# Patient Record
Sex: Male | Born: 1955 | Race: White | Hispanic: No | Marital: Married | Smoking: Never smoker
Health system: Southern US, Community
[De-identification: ages and names within clinical notes are randomized; demographics above are authoritative.]

## PROBLEM LIST (undated history)

## (undated) DIAGNOSIS — N189 Chronic kidney disease, unspecified: Secondary | ICD-10-CM

## (undated) DIAGNOSIS — T7840XA Allergy, unspecified, initial encounter: Secondary | ICD-10-CM

## (undated) DIAGNOSIS — E785 Hyperlipidemia, unspecified: Secondary | ICD-10-CM

## (undated) HISTORY — PX: SEPTOPLASTY: SUR1290

## (undated) HISTORY — DX: Hyperlipidemia, unspecified: E78.5

## (undated) HISTORY — PX: SHOULDER ARTHROSCOPY: SHX128

## (undated) HISTORY — DX: Chronic kidney disease, unspecified: N18.9

## (undated) HISTORY — PX: COLONOSCOPY: SHX174

## (undated) HISTORY — DX: Allergy, unspecified, initial encounter: T78.40XA

## (undated) HISTORY — PX: POLYPECTOMY: SHX149

---

## 2000-09-12 ENCOUNTER — Encounter: Payer: Self-pay | Admitting: Urology

## 2000-09-12 ENCOUNTER — Encounter: Admission: RE | Admit: 2000-09-12 | Discharge: 2000-09-12 | Payer: Self-pay | Admitting: Urology

## 2004-12-15 ENCOUNTER — Ambulatory Visit: Payer: Self-pay | Admitting: Internal Medicine

## 2004-12-29 ENCOUNTER — Ambulatory Visit: Payer: Self-pay | Admitting: Internal Medicine

## 2004-12-29 ENCOUNTER — Encounter (INDEPENDENT_AMBULATORY_CARE_PROVIDER_SITE_OTHER): Payer: Self-pay | Admitting: Specialist

## 2007-05-21 ENCOUNTER — Ambulatory Visit (HOSPITAL_BASED_OUTPATIENT_CLINIC_OR_DEPARTMENT_OTHER): Admission: RE | Admit: 2007-05-21 | Discharge: 2007-05-21 | Payer: Self-pay | Admitting: Orthopedic Surgery

## 2007-11-25 ENCOUNTER — Ambulatory Visit: Payer: Self-pay | Admitting: Cardiology

## 2007-11-26 ENCOUNTER — Ambulatory Visit: Payer: Self-pay | Admitting: Cardiology

## 2007-11-26 LAB — CONVERTED CEMR LAB
BUN: 20 mg/dL (ref 6–23)
CO2: 25 meq/L (ref 19–32)
Calcium: 9.2 mg/dL (ref 8.4–10.5)
Chloride: 103 meq/L (ref 96–112)
Creatinine, Ser: 1 mg/dL (ref 0.4–1.5)
GFR calc Af Amer: 101 mL/min
GFR calc non Af Amer: 83 mL/min
Glucose, Bld: 95 mg/dL (ref 70–99)
Potassium: 4.1 meq/L (ref 3.5–5.1)
Sodium: 136 meq/L (ref 135–145)

## 2007-12-02 ENCOUNTER — Ambulatory Visit (HOSPITAL_COMMUNITY): Admission: RE | Admit: 2007-12-02 | Discharge: 2007-12-02 | Payer: Self-pay | Admitting: Cardiology

## 2007-12-02 ENCOUNTER — Ambulatory Visit: Payer: Self-pay | Admitting: Cardiovascular Disease

## 2009-06-21 ENCOUNTER — Ambulatory Visit (HOSPITAL_BASED_OUTPATIENT_CLINIC_OR_DEPARTMENT_OTHER): Admission: RE | Admit: 2009-06-21 | Discharge: 2009-06-21 | Payer: Self-pay | Admitting: Orthopedic Surgery

## 2010-07-03 LAB — POCT HEMOGLOBIN-HEMACUE: Hemoglobin: 13.7 g/dL (ref 13.0–17.0)

## 2010-07-14 ENCOUNTER — Other Ambulatory Visit: Payer: Self-pay | Admitting: Plastic Surgery

## 2010-07-29 ENCOUNTER — Other Ambulatory Visit: Payer: Self-pay | Admitting: Plastic Surgery

## 2010-08-23 NOTE — Assessment & Plan Note (Signed)
[facility] HEALTHCARE                            CARDIOLOGY OFFICE NOTE   Edgar Clark, Edgar Clark                        MRN:          [id number]  DATE:11/25/2007                            DOB:          07/28/1955    Edgar Clark is a 55 year old married white male, friend of mine, who  comes self-referred today for premature history of coronary disease.   He is extremely active, works out 4-5 days a week at [employer]  with me, and has no symptoms of angina or ischemia.  He also snowboards  and skis at high altitude and has no symptoms.   His real concern is that his brother just died at age 22 of a heart  attack.  His brother was not nearly as good shape as Edgar Clark, however, his  father also had a heart attack at age 50 (at that time his father was  overweight), and he has a half-brother who had a heart attack at age 57.  He has several risk factors including being overweight.   Edgar Clark has had some mild hyperlipidemia in the past and is on Lipitor, but  does not remember the dose.  He also takes 81 mg of aspirin per day.  His last cholesterol, he says, were around 150.  His HDL was greater  than 40 but he cannot remember the number.  LDL was around 70s and  triglycerides were okay.   PAST MEDICAL HISTORY:  He does not smoke.   SURGICAL HISTORY:  He had a shoulder rotator cuff repair in February  2009.  He has had a deviated septum repair in 1968.   He has a couple of alcoholic beverages a day.   His family history is as above.   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  He travels quite a bit  particularly in [place].   He is married and has 2 children.   REVIEW OF SYSTEMS:  Other than some seasonal allergies and some anxiety  he really has a negative review of systems.  All systems queried and  reviewed.   PHYSICAL EXAMINATION:  GENERAL:  He is 6 feet 3, weighs 200 pounds.  VITAL SIGNS:  His blood pressure 120/88 in his left arm.  His pulse  is  66 and regular.  HEENT:  Normocephalic and atraumatic.  PERRL.  Extraocular movements are  intact.  Sclerae are clear.  Facial symmetry is normal.  Dentition  satisfactory.  NECK:  Supple.  Carotids upstrokes are equal bilateral without bruits.  No JVD.  Thyroid is not enlarged.  Trachea is midline.  LUNGS:  Clear.  HEART:  A nondisplaced PMI.  Normal S1 and S2.  ABDOMEN:  Soft, good bowel sounds.  No midline bruit.  No hepatomegaly.  EXTREMITIES:  No cyanosis, clubbing, or edema.  Pulses are intact.  NEUROLOGIC:  Exam is intact.   Electrocardiogram shows normal sinus rhythm with minimal first-degree AV  block at 222 milliseconds.  There is some nonspecific ST-segment  changes.   ASSESSMENT AND PLAN:  I had a long talk with Edgar Clark  today.  He obviously  is at low-risk based on conventional risk factors, particularly with  being on a statin.  However, he is very concerned about his family  history as I told him today a lot of patient's have heart attacks or  coronary disease and this is not explained by conventional factors.   After a long discussion, we decided to proceed with CT angiography.  If  his arteries are pristine or has minimal plaque this will be reassuring,  particularly with his outdoor activities not to mention his work hours  and traveling.   PLAN:  1. Continue Lipitor.  Goal LDL would be 70.  2. Continue enteric-coated aspirin.  3. CT angiography.   Indications, risks, and potential benefits have been discussed.  Edgar Clark  agrees to proceed.  We will try to arrange this on the day when Dr.  Charlton Haws, our CT reader is available in the hospital.     Edgar Fus C. Daleen Squibb, MD, [facility]  Electronically Signed    TCW/MedQ  DD: 11/25/2007  DT: 11/26/2007  Job #: [id number]   cc:   Antony Madura, M.D.

## 2010-08-23 NOTE — Op Note (Signed)
NAME:  Edgar Clark, Edgar Clark                 ACCOUNT NO.:  [id number]   MEDICAL RECORD NO.:  [id number]          PATIENT TYPE:  AMB   LOCATION:  DSC                          FACILITY:  [facility]   PHYSICIAN:  Robert A. Thurston Hole, M.D. DATE OF BIRTH:  Jul 07, 1955   DATE OF PROCEDURE:  05/21/2007  DATE OF DISCHARGE:                               OPERATIVE REPORT   PREOPERATIVE DIAGNOSIS:  1. Left shoulder SLAP tear.  2. Left shoulder partial rotator cuff tear.  3. Left shoulder impingement.  4. 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   POSTOPERATIVE DIAGNOSIS:  1. Left shoulder SLAP tear.  2. Left shoulder partial rotator cuff tear.  3. Left shoulder impingement.  4. 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   PROCEDURE:  1. Left shoulder examination under anesthesia followed by      arthroscopically assisted SLAP repair using Arthrex push lock      anchors x2.  2. Left shoulder debridement of partial rotator cuff tear.  3. Left shoulder subacromial decompression.  4. Left shoulder distal clavicle excision.   SURGEON:  Elana Alm. Thurston Hole, M.D.   ASSISTANT:  Kirstin Shepperson, P.A.-C.   ANESTHESIA:  General.   OPERATIVE TIME:  1 hour 15 minutes.   COMPLICATIONS:  None.   INDICATIONS FOR PROCEDURE:  Edgar Clark is a 55 year old gentleman who  has had almost two years of left shoulder pain increasing in nature with  exam and MRI documenting rotator cuff tendinitis versus labral partial  tear with impingement and AC joint arthropathy.  He has failed  conservative care and is now to undergo arthroscopy.   DESCRIPTION:  Edgar Clark is brought to the operating room on May 21, 2007, after an interscalene block was placed in the holding room by  anesthesia.  He was placed on the operating table in supine position.  He received Ancef 1 gram IV preoperatively for prophylaxis.  After being  placed under  general anesthesia, his left shoulder was examined.  He had  full range of motion of his shoulder with stable ligamentous exam.  He  was then placed in the beach chair position and his shoulder and arm was  prepped using sterile DuraPrep and draped using sterile technique.  Originally, through a posterior arthroscopic portal, the arthroscope  with a pump attached was placed into an anterior portal and an  arthroscopic probe was placed.  On initial inspection, the articular  cartilage in the glenohumeral joint was intact.  The anterior labrum in  the mid portion and anterior inferior glenohumeral ligament complex was  intact.  The superior labrum was torn and detached from the superior  glenoid rim from the 10 o'clock position to the 2 o'clock position.  The  biceps tendon, itself, was intact, but the biceps tendon anchor was very  loose and detached in this area.  The posterior inferior labrum was  intact.  The rotator cuff showed partial tearing 25% of the  supraspinatus which was debrided.  The rest of the rotator  cuff was  intact.  The inferior capsular recess was free of pathology.  This  superior SLAP tear was felt to be amenable to repair.  Using an  accessory lateral portal through the rotator cuff, two separate Arthrex  push lock anchors were placed after the superior glenoid rim was  decorticated. Using a curved suture passer, one of the mattress sutures  was placed in the 1 to 2 o'clock position and the other one placed in  the 10 to 11 o'clock position and the two push lock anchors were  deployed, one in the 11 o'clock position and one in the 1 o'clock  position on either side of the biceps anchor with a firm and tight  fixation and repair achieved.  After this was done, the subacromial  space was entered and using a lateral portal, moderately thickened  bursitis was resected.  Impingement was noted and a subacromial  decompression was carried out removing 6-8 mm of the under  surface of  the anterior, anterolateral, and anteromedial acromion and CA ligament  release carried out, as well.  The [facility] joint showed significant spurring  and degenerative changes and the distal 5-6 mm of clavicle was resected  with a 6 mm bur.  The rotator cuff, itself, on the bursal surface was  somewhat inflamed with no evidence of a tear.  After this decompression  and distal clavicle excision was achieved, the shoulder was brought  through a full range of motion with no impingement on the rotator cuff.  At this point, it was felt that all pathology had been satisfactorily  addressed.  The instruments were removed.  The portals were closed with  3-0 nylon suture.  A sterile dressing and an abduction sling was  applied.  The patient was awakened and taken to recovery in stable  condition.   FOLLOW UP CARE:  Edgar Clark will be followed as an outpatient on  Percocet and Robaxin in an abduction sling with early physical therapy  per SLAP repair protocol.  I will see him back in the office in a week  for sutures out and follow up.      Robert A. Thurston Hole, M.D.  Electronically Signed     RAW/MEDQ  D:  05/21/2007  T:  05/22/2007  Job:  [id number]

## 2012-06-24 ENCOUNTER — Telehealth: Payer: Self-pay

## 2012-06-24 NOTE — Telephone Encounter (Signed)
Message copied by Chrystie Nose on Mon Jun 24, 2012  3:18 PM ------      Message from: Hilarie Fredrickson      Created: Fri Jun 21, 2012  3:29 PM      Regarding: direct colonoscopy       Bonita Quin,       Mr. Erbes is a very good friend of mine. He needs set up for a direct colonoscopy in the [facility] for "rectal bleeding". You can reach him at [phone] to help arrange. Thank you!  ------

## 2012-06-24 NOTE — Telephone Encounter (Signed)
Called pt to scheduled colon. Pt states he will call back to schedule once he looks at his calendar.

## 2014-04-28 ENCOUNTER — Encounter: Payer: Self-pay | Admitting: Internal Medicine

## 2014-05-20 ENCOUNTER — Encounter: Payer: Self-pay | Admitting: Internal Medicine

## 2014-06-22 ENCOUNTER — Telehealth: Payer: Self-pay

## 2014-06-22 NOTE — Telephone Encounter (Signed)
-----   Message from Irene Shipper, MD sent at 06/21/2014  2:41 PM EDT ----- Regarding: Colonoscopy prep instructions Vaughan Basta, Mr. Downs (a very good friend of mine) works in [place] during the week and was hoping to forego the pre-visit and receive instructions for his upcoming colonoscopy by phone / fax. Please handle this personally for me. His cell number is [phone]. Thank you!!! Dr. [name]

## 2014-06-22 NOTE — Telephone Encounter (Signed)
Left message for pt to call back.  Left message to call back. 06/24/14

## 2014-06-24 ENCOUNTER — Other Ambulatory Visit: Payer: Self-pay

## 2014-06-24 MED ORDER — PEG-KCL-NACL-NASULF-NA ASC-C 100 G PO SOLR: 1.0000 | Freq: Once | ORAL | Status: DC

## 2014-06-24 NOTE — Telephone Encounter (Signed)
Instructions faxed to pt at [phone], prescription for prep sent to pharmacy for pt.

## 2014-07-15 ENCOUNTER — Ambulatory Visit (AMBULATORY_SURGERY_CENTER): Payer: PRIVATE HEALTH INSURANCE | Admitting: Internal Medicine

## 2014-07-15 ENCOUNTER — Encounter: Payer: Self-pay | Admitting: Internal Medicine

## 2014-07-15 VITALS — BP 128/58 | HR 59 | Temp 96.6°F | Resp 14 | Ht 75.0 in | Wt 205.0 lb

## 2014-07-15 DIAGNOSIS — Z1211 Encounter for screening for malignant neoplasm of colon: Secondary | ICD-10-CM | POA: Diagnosis present

## 2014-07-15 DIAGNOSIS — D122 Benign neoplasm of ascending colon: Secondary | ICD-10-CM

## 2014-07-15 DIAGNOSIS — D123 Benign neoplasm of transverse colon: Secondary | ICD-10-CM

## 2014-07-15 MED ORDER — SODIUM CHLORIDE 0.9 % IV SOLN: 500.0000 mL | INTRAVENOUS | Status: DC

## 2014-07-15 NOTE — Patient Instructions (Signed)
YOU HAD AN ENDOSCOPIC PROCEDURE TODAY AT [facility]:   Refer to the procedure report that was given to you for any specific questions about what was found during the examination.  If the procedure report does not answer your questions, please call your gastroenterologist to clarify.  If you requested that your care partner not be given the details of your procedure findings, then the procedure report has been included in a sealed envelope for you to review at your convenience later.  YOU SHOULD EXPECT: Some feelings of bloating in the abdomen. Passage of more gas than usual.  Walking can help get rid of the air that was put into your GI tract during the procedure and reduce the bloating. If you had a lower endoscopy (such as a colonoscopy or flexible sigmoidoscopy) you may notice spotting of blood in your stool or on the toilet paper. If you underwent a bowel prep for your procedure, you may not have a normal bowel movement for a few days.  Please Note:  You might notice some irritation and congestion in your nose or some drainage.  This is from the oxygen used during your procedure.  There is no need for concern and it should clear up in a day or so.  SYMPTOMS TO REPORT IMMEDIATELY:   Following lower endoscopy (colonoscopy or flexible sigmoidoscopy):  Excessive amounts of blood in the stool  Significant tenderness or worsening of abdominal pains  Swelling of the abdomen that is new, acute  Fever of 100F or higher   For urgent or emergent issues, a gastroenterologist can be reached at any hour by calling [phone].   DIET: Your first meal following the procedure should be a small meal and then it is ok to progress to your normal diet. Heavy or fried foods are harder to digest and may make you feel nauseous or bloated.  Likewise, meals heavy in dairy and vegetables can increase bloating.  Drink plenty of fluids but you should avoid alcoholic beverages for 24  hours.  ACTIVITY:  You should plan to take it easy for the rest of today and you should NOT DRIVE or use heavy machinery until tomorrow (because of the sedation medicines used during the test).    FOLLOW UP: Our staff will call the number listed on your records the next business day following your procedure to check on you and address any questions or concerns that you may have regarding the information given to you following your procedure. If we do not reach you, we will leave a message.  However, if you are feeling well and you are not experiencing any problems, there is no need to return our call.  We will assume that you have returned to your regular daily activities without incident.  If any biopsies were taken you will be contacted by phone or by letter within the next 1-3 weeks.  Please call us at [phone] if you have not heard about the biopsies in 3 weeks.    SIGNATURES/CONFIDENTIALITY: You and/or your care partner have signed paperwork which will be entered into your electronic medical record.  These signatures attest to the fact that that the information above on your After Visit Summary has been reviewed and is understood.  Full responsibility of the confidentiality of this discharge information lies with you and/or your care-partner.  Polys-handouts given

## 2014-07-15 NOTE — Op Note (Signed)
[place]  [address] [place], [id number]   COLONOSCOPY PROCEDURE REPORT  PATIENT: Edgar Clark, Edgar Clark  MR#: [id number] BIRTHDATE: Feb 12, 1956 , 53  yrs. old GENDER: male ENDOSCOPIST: Eustace Quail, MD REFERRED EK:[id number] Recall, PROCEDURE DATE:  07/15/2014 PROCEDURE:   Colonoscopy, screening and Colonoscopy with snare polypectomy x 4 First Screening Colonoscopy - Avg.  risk and is 50 yrs.  old or older - No.  Prior Negative Screening - Now for repeat screening. 10 or more years since last screening  History of Adenoma - Now for follow-up colonoscopy & has been > or = to 3 yrs.  N/A ASA CLASS:   Class II INDICATIONS:Screening for colonic neoplasia and Colorectal Neoplasm Risk Assessment for this procedure is average risk.  Negative exam 10 years ago MEDICATIONS: Monitored anesthesia care and Propofol 350 mg IV  DESCRIPTION OF PROCEDURE:   After the risks benefits and alternatives of the procedure were thoroughly explained, informed consent was obtained.  The digital rectal exam revealed no abnormalities of the rectum.   The LB [id number]  endoscope was introduced through the anus and advanced to the cecum, which was identified by both the appendix and ileocecal valve. No adverse events experienced.   The quality of the prep was excellent. (MoviPrep was used)  The instrument was then slowly withdrawn as the colon was fully examined.   COLON FINDINGS: Four polyps ranging between 3-71mm in size were found in the transverse colon (3) and ascending colon.  A polypectomy was performed with a cold snare.  The resection was complete, the polyp tissue was completely retrieved and sent to histology.   There was mild diverticulosis noted in the sigmoid colon and ascending colon. The examination was otherwise normal.  Retroflexed views revealed internal hemorrhoids. The time to cecum = 2.4 Withdrawal time = 22.6   The scope was withdrawn and the procedure  completed. COMPLICATIONS: There were no immediate complications.  ENDOSCOPIC IMPRESSION: 1.   Four polyps were found in the transverse colon and ascending colon; polypectomy was performed with a cold snare 2.   Mild diverticulosis was noted in the sigmoid colon and ascending colon 3.   The examination was otherwise normal  RECOMMENDATIONS: 1. Repeat colonoscopy in 5 years if polyp adenomatous or sessile serrated polyp; otherwise 10 years  eSigned:  Eustace Quail, MD 07/15/2014 11:01 AM   cc: Lorene Dy, MD and The Patient

## 2014-07-15 NOTE — Progress Notes (Signed)
Called to room to assist during endoscopic procedure.  Patient ID and intended procedure confirmed with present staff. Received instructions for my participation in the procedure from the performing physician.  

## 2014-07-15 NOTE — Progress Notes (Signed)
Patient awakening,vss,report to rn 

## 2014-07-16 ENCOUNTER — Telehealth: Payer: Self-pay | Admitting: *Deleted

## 2014-07-16 NOTE — Telephone Encounter (Signed)
No answer, message left for the patient. 

## 2014-07-20 ENCOUNTER — Encounter: Payer: Self-pay | Admitting: Internal Medicine

## 2014-08-03 ENCOUNTER — Telehealth: Payer: Self-pay | Admitting: *Deleted

## 2014-08-04 NOTE — Telephone Encounter (Signed)
Error entry

## 2014-08-25 ENCOUNTER — Other Ambulatory Visit: Payer: Self-pay | Admitting: Internal Medicine

## 2014-08-25 ENCOUNTER — Ambulatory Visit
Admission: RE | Admit: 2014-08-25 | Discharge: 2014-08-25 | Disposition: A | Discharge status: Home / Self Care | Payer: PRIVATE HEALTH INSURANCE | Source: Ambulatory Visit | Attending: Internal Medicine | Admitting: Internal Medicine

## 2014-08-25 DIAGNOSIS — R0781 Pleurodynia: Secondary | ICD-10-CM

## 2015-06-01 IMAGING — CR DG RIBS W/ CHEST 3+V*L*
5 series · 5 of 5 positions shown · non-contrast
Comparison: Chest CT dated 12/02/2007

CLINICAL DATA: Left posterior rib pain for 8 months.

EXAM:
LEFT RIBS AND CHEST - 3+ VIEW

[w ribs ap/pa upper left]
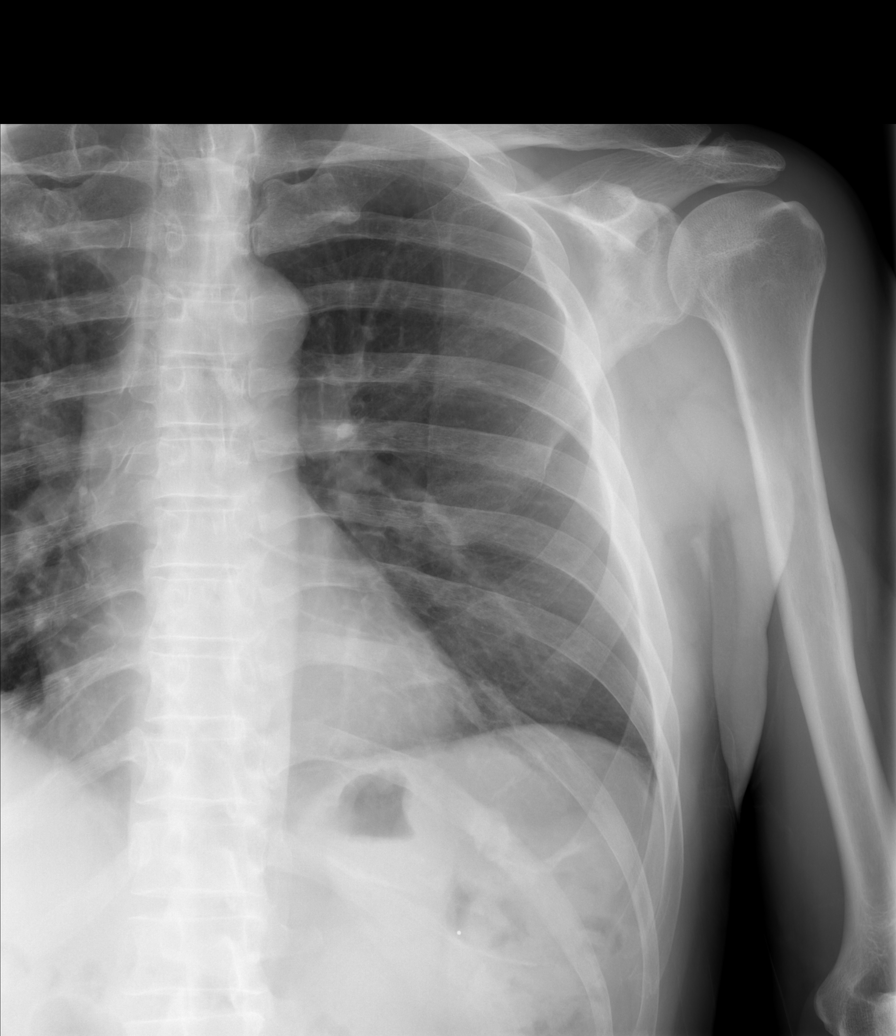

[w ribs ap/pa lower left]
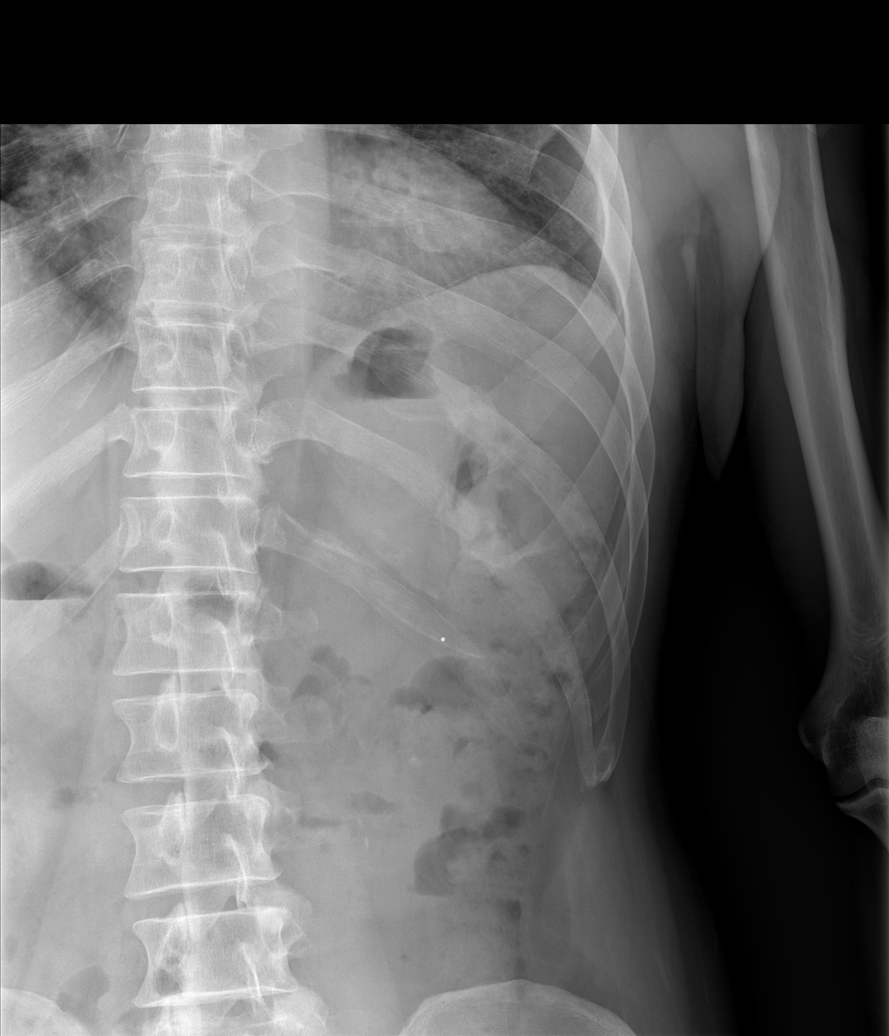

[w ribs oblique left (1 of 2)]
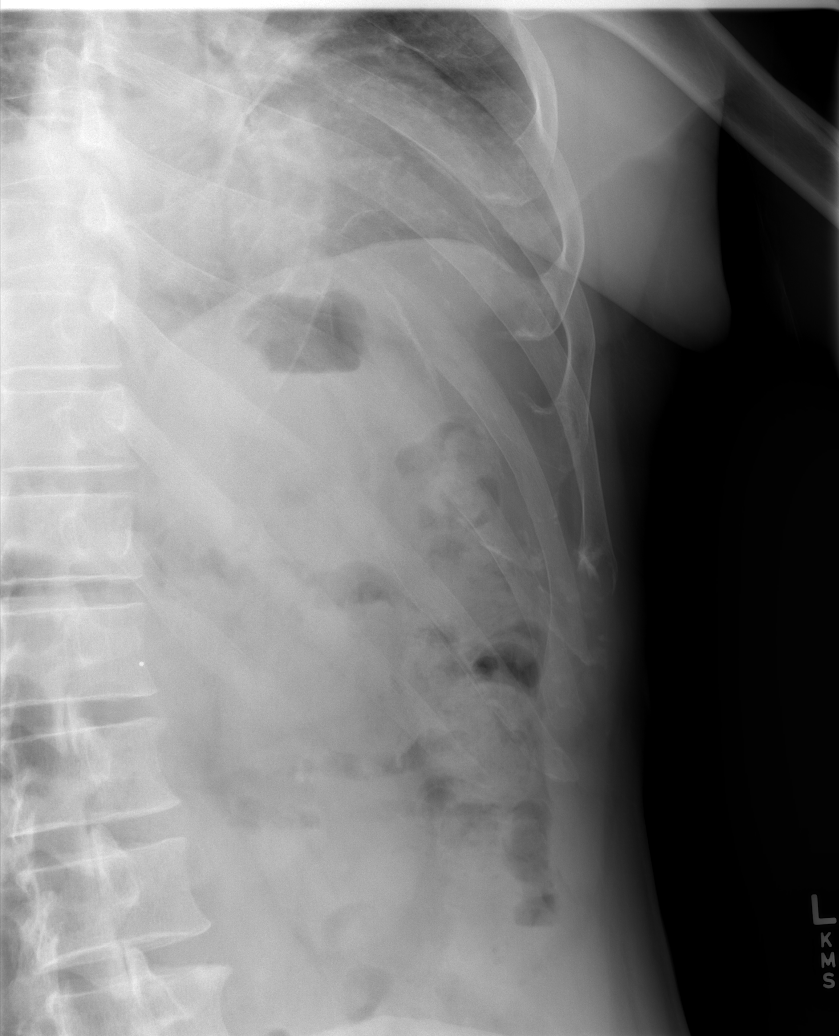

[w ribs oblique left (2 of 2)]
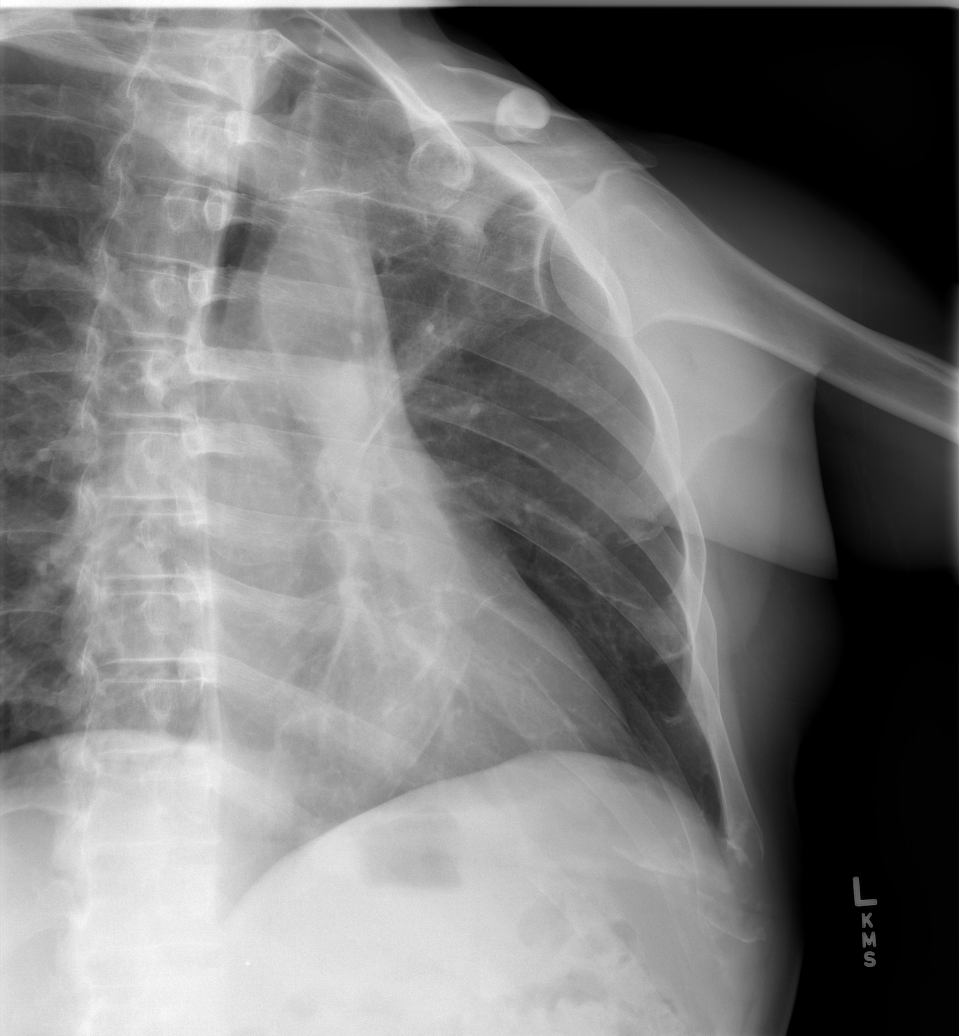

[w chest pa]
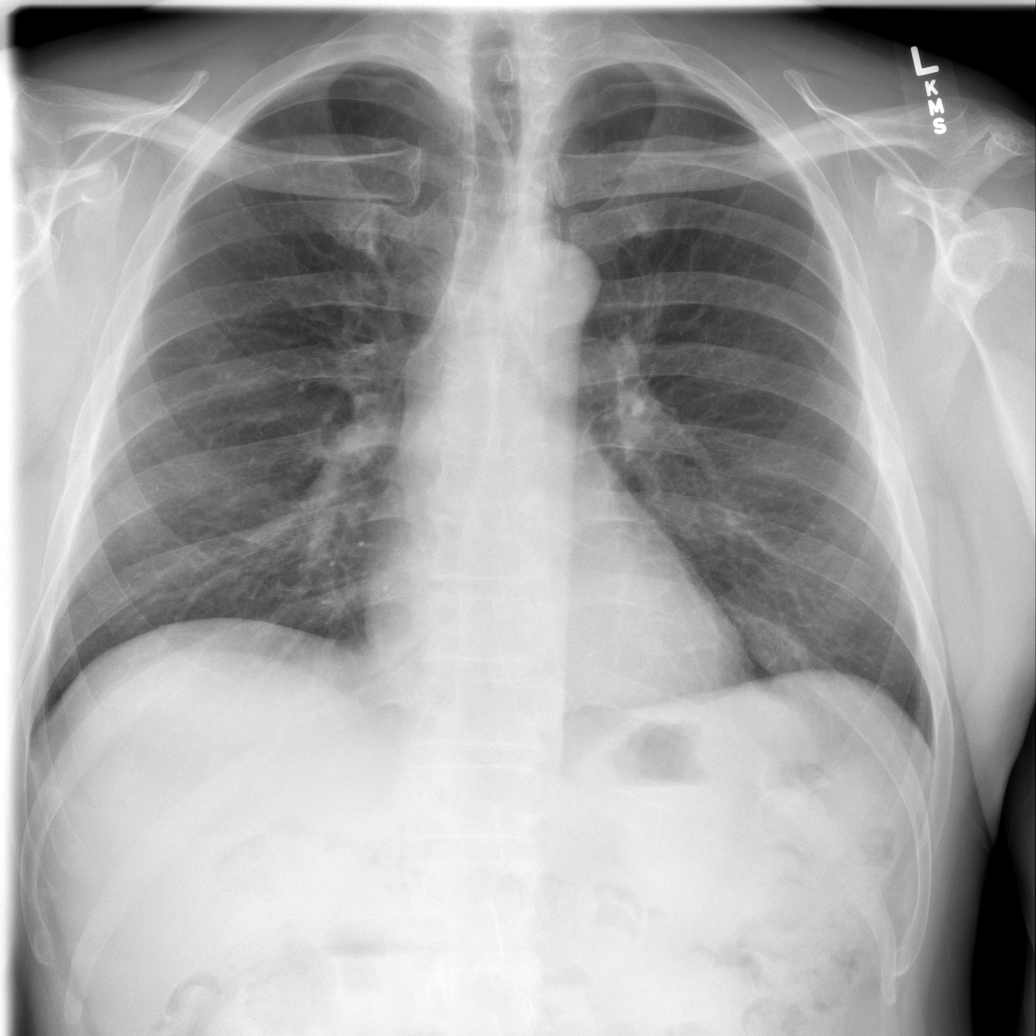

[5 of 5 positions shown; findings below may reference images not displayed]

FINDINGS: There are old fractures of the posterior aspects of the left tenth
and eleventh ribs. There may be nonunion of the left eleventh rib
fracture.

The other left ribs are normal. Heart size and vascularity are
normal. Lungs are clear.
IMPRESSION: No acute abnormality. Old fractures of the left tenth and eleventh
ribs. Possible nonunion of the left tenth rib fracture.

## 2018-06-07 ENCOUNTER — Emergency Department (HOSPITAL_COMMUNITY)
Admission: EM | Admit: 2018-06-07 | Discharge: 2018-06-07 | Disposition: A | Discharge status: Home / Self Care | Payer: 59 | Attending: Emergency Medicine | Admitting: Emergency Medicine

## 2018-06-07 ENCOUNTER — Encounter (HOSPITAL_COMMUNITY): Payer: Self-pay

## 2018-06-07 ENCOUNTER — Emergency Department (HOSPITAL_COMMUNITY): Payer: 59

## 2018-06-07 ENCOUNTER — Other Ambulatory Visit: Payer: Self-pay

## 2018-06-07 DIAGNOSIS — R0789 Other chest pain: Secondary | ICD-10-CM | POA: Insufficient documentation

## 2018-06-07 DIAGNOSIS — R079 Chest pain, unspecified: Secondary | ICD-10-CM | POA: Diagnosis present

## 2018-06-07 DIAGNOSIS — Z79899 Other long term (current) drug therapy: Secondary | ICD-10-CM | POA: Diagnosis not present

## 2018-06-07 LAB — TROPONIN I
Troponin I: <0.03 ng/mL (ref <0.03)
Troponin I: <0.03 ng/mL (ref <0.03)

## 2018-06-07 LAB — BASIC METABOLIC PANEL
Anion gap: 12 (ref 5–15)
BUN: 22 mg/dL (ref 8–23)
CALCIUM: 9.2 mg/dL (ref 8.9–10.3)
CHLORIDE: 103 mmol/L (ref 98–111)
CO2: 21 mmol/L — AB (ref 22–32)
CREATININE: 1.07 mg/dL (ref 0.61–1.24)
GFR calc non Af Amer: >60 mL/min (ref >60)
Glucose, Bld: 85 mg/dL (ref 70–99)
POTASSIUM: 3.8 mmol/L (ref 3.5–5.1)
SODIUM: 136 mmol/L (ref 135–145)

## 2018-06-07 LAB — CBC
HCT: 45.1 % (ref 39.0–52.0)
Hemoglobin: 14.5 g/dL (ref 13.0–17.0)
MCH: 30.3 pg (ref 26.0–34.0)
MCHC: 32.2 g/dL (ref 30.0–36.0)
MCV: 94.2 fL (ref 80.0–100.0)
NRBC: 0 % (ref 0.0–0.2)
PLATELETS: 198 10*3/uL (ref 150–400)
RBC: 4.79 MIL/uL (ref 4.22–5.81)
RDW: 12.7 % (ref 11.5–15.5)
WBC: 5.9 10*3/uL (ref 4.0–10.5)

## 2018-06-07 NOTE — ED Triage Notes (Signed)
Pt endorses right sided chest pain x 1 hour after sneezing really hard. Denies any other sx. Hypertensive. No cardiac hx.

## 2018-06-07 NOTE — ED Provider Notes (Signed)
[facility] EMERGENCY DEPARTMENT Provider Note   CSN: [id number] Arrival date & time: 06/07/18  1750    History   Chief Complaint Chief Complaint  Patient presents with  . Chest Pain    HPI Edgar Clark is a 63 y.o. male.     The history is provided by the patient. No language interpreter was used.  Chest Pain     Generally healthy 63 year old male presenting for evaluation of chest pain.  Patient report approximately 4 hours ago he recalls sneezing very hard and subsequently developed pain to the side of his chest.  Pain is described as a sharp sensation, improves when he stands up and worsening with certain movement.  Pain is not associated with exertion, no associated lightheadedness, dizziness, diaphoresis, shortness of breath, nausea.  He did not think much about the pain until he went home and the more he thought about it patient felt he should "get it checked out".  At this time he denies any active pain.  States cp barely lasting for less than an hour.  States that he does not have any cardiac history.  Denies tobacco use, drink alcohol on occasion.  He denies any strenuous activities, no fever, no chills, no productive cough or shortness of breath or hemoptysis.  He did to some Advil which did help.  States that he does annual physical regularly and his EKG is always "perfect".   Past Medical History:  Diagnosis Date  . Allergy     There are no active problems to display for this patient.   Past Surgical History:  Procedure Laterality Date  . SEPTOPLASTY    . SHOULDER ARTHROSCOPY Bilateral         Home Medications    Prior to Admission medications   Medication Sig Start Date End Date Taking? Authorizing Provider  atorvastatin (LIPITOR) 10 MG tablet Take 10 mg by mouth daily.    [provider]    Family History Family History  Problem Relation Age of Onset  . Heart failure Father   . Heart attack Brother     Social  History Social History   Tobacco Use  . Smoking status: Never Smoker  Substance Use Topics  . Alcohol use: Yes    Alcohol/week: 0.0 standard drinks    Frequency: Never    Comment: occ  . Drug use: Never     Allergies   Patient has no known allergies.   Review of Systems Review of Systems  Cardiovascular: Positive for chest pain.  All other systems reviewed and are negative.    Physical Exam Updated Vital Signs BP (!) 167/100 (BP Location: Right Arm)   Pulse 60   Temp (!) 97.5 F (36.4 C) (Oral)   Resp 18   Ht 6\' 3"  (1.905 m)   Wt 93 kg   SpO2 98%   BMI 25.62 kg/m   Physical Exam Vitals signs and nursing note reviewed.  Constitutional:      General: He is not in acute distress.    Appearance: He is well-developed.  HENT:     Head: Atraumatic.  Eyes:     Conjunctiva/sclera: Conjunctivae normal.  Neck:     Musculoskeletal: Neck supple.  Cardiovascular:     Rate and Rhythm: Normal rate.     Heart sounds: Normal heart sounds.  Pulmonary:     Effort: Pulmonary effort is normal.     Breath sounds: Normal breath sounds.  Chest:     Chest  wall: No tenderness.  Abdominal:     Palpations: Abdomen is soft.     Tenderness: There is no abdominal tenderness.  Musculoskeletal:     Right lower leg: No edema.     Left lower leg: No edema.  Skin:    Findings: No rash.  Neurological:     Mental Status: He is alert.      ED Treatments / Results  Labs (all labs ordered are listed, but only abnormal results are displayed) Labs Reviewed  BASIC METABOLIC PANEL - Abnormal; Notable for the following components:      Result Value   CO2 21 (*)    All other components within normal limits  CBC  TROPONIN I  TROPONIN I    EKG EKG Interpretation  Date/Time:  Friday June 07 2018 17:59:35 EST Ventricular Rate:  74 PR Interval:  202 QRS Duration: 108 QT Interval:  368 QTC Calculation: 408 R Axis:   74 Text Interpretation:  Normal sinus rhythm Anterior  infarct , age undetermined Abnormal ECG similar to prior 3/11 Confirmed by Aletta Edouard [id number]) on 06/07/2018 8:29:28 PM   Radiology Dg Chest 2 View  Result Date: 06/07/2018 CLINICAL DATA:  Chest pain EXAM: CHEST - 2 VIEW COMPARISON:  08/25/2014 FINDINGS: Heart and mediastinal contours are within normal limits. No focal opacities or effusions. No acute bony abnormality. IMPRESSION: No active cardiopulmonary disease. Electronically Signed   By: Rolm Baptise M.D.   On: 06/07/2018 18:36    Procedures Procedures (including critical care time)  Medications Ordered in ED Medications - No data to display   Initial Impression / Assessment and Plan / ED Course  I have reviewed the triage vital signs and the nursing notes.  Pertinent labs & imaging results that were available during my care of the patient were reviewed by me and considered in my medical decision making (see chart for details).        BP (!) 141/96   Pulse 65   Temp (!) 97.5 F (36.4 C) (Oral)   Resp 17   Ht 6\' 3"  (1.905 m)   Wt 93 kg   SpO2 96%   BMI 25.62 kg/m    Final Clinical Impressions(s) / ED Diagnoses   Final diagnoses:  Atypical chest pain    ED Discharge Orders    None     8:51 PM Patient developed right-sided chest pain after a day or sneeze.  Pain is atypical of ACS.  Heart pathway score of 1, low risk of mace.  Chest x-ray without evidence of pneumothorax or concerning features.  Work-up unremarkable.  Since pain started 4 hours ago, will obtain delta troponin.  10:40 PM Negative delta trop.  Pt resting comfortable.  Reassurance given.  Stable for discharge.  Return precaution discussed.   Domenic Moras, PA-C 06/07/18 2257    Hayden Rasmussen, MD 06/08/18 [phone]

## 2018-10-09 ENCOUNTER — Other Ambulatory Visit: Payer: Self-pay

## 2018-10-09 ENCOUNTER — Encounter (HOSPITAL_COMMUNITY): Payer: Self-pay | Admitting: Emergency Medicine

## 2018-10-09 ENCOUNTER — Emergency Department (HOSPITAL_COMMUNITY): Payer: 59

## 2018-10-09 ENCOUNTER — Emergency Department (HOSPITAL_COMMUNITY)
Admission: EM | Admit: 2018-10-09 | Discharge: 2018-10-09 | Disposition: A | Discharge status: Home / Self Care | Payer: 59 | Attending: Emergency Medicine | Admitting: Emergency Medicine

## 2018-10-09 DIAGNOSIS — R1110 Vomiting, unspecified: Secondary | ICD-10-CM | POA: Insufficient documentation

## 2018-10-09 DIAGNOSIS — R1032 Left lower quadrant pain: Secondary | ICD-10-CM | POA: Diagnosis present

## 2018-10-09 DIAGNOSIS — N201 Calculus of ureter: Secondary | ICD-10-CM

## 2018-10-09 DIAGNOSIS — N23 Unspecified renal colic: Secondary | ICD-10-CM

## 2018-10-09 DIAGNOSIS — N132 Hydronephrosis with renal and ureteral calculous obstruction: Secondary | ICD-10-CM | POA: Diagnosis not present

## 2018-10-09 LAB — URINALYSIS, ROUTINE W REFLEX MICROSCOPIC
Bilirubin Urine: NEGATIVE
Glucose, UA: NEGATIVE mg/dL
Ketones, ur: 5 mg/dL — AB
Leukocytes,Ua: NEGATIVE
Nitrite: NEGATIVE
Protein, ur: 100 mg/dL — AB
RBC / HPF: >50 RBC/hpf — ABNORMAL HIGH (ref 0–5)
Specific Gravity, Urine: 1.025 (ref 1.005–1.030)
pH: 5 (ref 5.0–8.0)

## 2018-10-09 LAB — CBC
HCT: 41.3 % (ref 39.0–52.0)
Hemoglobin: 13.6 g/dL (ref 13.0–17.0)
MCH: 31 pg (ref 26.0–34.0)
MCHC: 32.9 g/dL (ref 30.0–36.0)
MCV: 94.1 fL (ref 80.0–100.0)
Platelets: 160 10*3/uL (ref 150–400)
RBC: 4.39 MIL/uL (ref 4.22–5.81)
RDW: 12.4 % (ref 11.5–15.5)
WBC: 3.9 10*3/uL — ABNORMAL LOW (ref 4.0–10.5)
nRBC: 0 % (ref 0.0–0.2)

## 2018-10-09 LAB — COMPREHENSIVE METABOLIC PANEL
ALT: 26 U/L (ref 0–44)
AST: 33 U/L (ref 15–41)
Albumin: 3.9 g/dL (ref 3.5–5.0)
Alkaline Phosphatase: 54 U/L (ref 38–126)
Anion gap: 8 (ref 5–15)
BUN: 20 mg/dL (ref 8–23)
CO2: 24 mmol/L (ref 22–32)
Calcium: 9.2 mg/dL (ref 8.9–10.3)
Chloride: 108 mmol/L (ref 98–111)
Creatinine, Ser: 1.07 mg/dL (ref 0.61–1.24)
GFR calc Af Amer: >60 mL/min (ref >60)
GFR calc non Af Amer: >60 mL/min (ref >60)
Glucose, Bld: 125 mg/dL — ABNORMAL HIGH (ref 70–99)
Potassium: 3.8 mmol/L (ref 3.5–5.1)
Sodium: 140 mmol/L (ref 135–145)
Total Bilirubin: 1.1 mg/dL (ref 0.3–1.2)
Total Protein: 6.4 g/dL — ABNORMAL LOW (ref 6.5–8.1)

## 2018-10-09 MED ORDER — OXYCODONE-ACETAMINOPHEN 5-325 MG PO TABS: 2.0000 | ORAL_TABLET | ORAL | 0 refills | Status: DC | PRN

## 2018-10-09 MED ORDER — KETOROLAC TROMETHAMINE 30 MG/ML IJ SOLN
15.0000 mg | Freq: Once | INTRAMUSCULAR | Status: AC
  Administered 2018-10-09: 15 mg via INTRAVENOUS
  Filled 2018-10-09: qty 1

## 2018-10-09 MED ORDER — SODIUM CHLORIDE 0.9 % IV BOLUS
500.0000 mL | Freq: Once | INTRAVENOUS | Status: AC
  Administered 2018-10-09: 13:00:00 500 mL via INTRAVENOUS

## 2018-10-09 MED ORDER — ONDANSETRON 8 MG PO TBDP: 8.0000 mg | ORAL_TABLET | Freq: Three times a day (TID) | ORAL | 0 refills | Status: DC | PRN

## 2018-10-09 MED ORDER — CEPHALEXIN 500 MG PO CAPS: 500.0000 mg | ORAL_CAPSULE | Freq: Four times a day (QID) | ORAL | 0 refills | Status: DC

## 2018-10-09 NOTE — ED Notes (Signed)
IV has been taken out 

## 2018-10-09 NOTE — ED Provider Notes (Signed)
[facility] EMERGENCY DEPARTMENT Provider Note   CSN: [id number] Arrival date & time: 10/09/18  1204     History   Chief Complaint Chief Complaint  Patient presents with  . Flank Pain    HPI Edgar Clark is a 63 y.o. male.     HPI  63 yo male co left flank pain began about 45 minutes ago waxing and waning, left low back radiating to front.  9/10 at worse with vomiting (dry heaves.  Pain sponatneously imporved now 3-4/10.  No intervention.   Past Medical History:  Diagnosis Date  . Allergy     There are no active problems to display for this patient.   Past Surgical History:  Procedure Laterality Date  . SEPTOPLASTY    . SHOULDER ARTHROSCOPY Bilateral         Home Medications    Prior to Admission medications   Medication Sig Start Date End Date Taking? Authorizing Provider  atorvastatin (LIPITOR) 40 MG tablet Take 10 mg by mouth daily.     [provider]    Family History Family History  Problem Relation Age of Onset  . Heart failure Father   . Heart attack Brother     Social History Social History   Tobacco Use  . Smoking status: Never Smoker  Substance Use Topics  . Alcohol use: Yes    Alcohol/week: 0.0 standard drinks    Frequency: Never    Comment: occ  . Drug use: Never     Allergies   Patient has no known allergies.   Review of Systems Review of Systems  All other systems reviewed and are negative.    Physical Exam Updated Vital Signs There were no vitals taken for this visit.  Physical Exam Vitals signs and nursing note reviewed.  Constitutional:      General: He is not in acute distress.    Appearance: Normal appearance. He is not ill-appearing.  HENT:     Head: Normocephalic.     Right Ear: External ear normal.     Left Ear: External ear normal.     Nose: Nose normal.     Mouth/Throat:     Mouth: Mucous membranes are moist.  Eyes:     Pupils: Pupils are equal, round, and reactive to  light.  Neck:     Musculoskeletal: Normal range of motion.  Cardiovascular:     Rate and Rhythm: Normal rate and regular rhythm.     Pulses: Normal pulses.  Pulmonary:     Effort: Pulmonary effort is normal.     Breath sounds: Normal breath sounds.  Abdominal:     General: Abdomen is flat.     Palpations: Abdomen is soft.  Musculoskeletal: Normal range of motion.  Skin:    General: Skin is warm and dry.     Capillary Refill: Capillary refill takes less than 2 seconds.  Neurological:     General: No focal deficit present.     Mental Status: He is alert. Mental status is at baseline.  Psychiatric:        Mood and Affect: Mood normal.      ED Treatments / Results  Labs (all labs ordered are listed, but only abnormal results are displayed) Labs Reviewed  URINALYSIS, ROUTINE W REFLEX MICROSCOPIC  CBC  COMPREHENSIVE METABOLIC PANEL    EKG None  Radiology No results found.  Procedures Procedures (including critical care time)  Medications Ordered in ED Medications  sodium chloride 0.9 %  bolus 500 mL (has no administration in time range)  ketorolac (TORADOL) 30 MG/ML injection 15 mg (has no administration in time range)     Initial Impression / Assessment and Plan / ED Course  I have reviewed the triage vital signs and the nursing notes.  Pertinent labs & imaging results that were available during my care of the patient were reviewed by me and considered in my medical decision making (see chart for details).       2:05 PM Patient pain free Discussed results Urology paged Cussed patient care with Dr. Arther Dames Will obtain [facility] COVID testing in case patient needs procedure this week. Patient is to call Dr. McDermott's office tomorrow to make follow-up appointment Plan prescription for pain medicine, antiemetic, and Cipro. Urine is being cultured Discussed all results, plans, and testing options with patient.  He voices understanding of plan  Final Clinical  Impressions(s) / ED Diagnoses   Final diagnoses:  Ureteral colic  Ureteral stone    ED Discharge Orders    None       Pattricia Boss, MD 10/09/18 1524

## 2018-10-09 NOTE — ED Triage Notes (Signed)
Pt here for evaluation of left sided flank pain that began today. Pt denies any injury. Pt states pain is intermittent. No known allergies.

## 2018-10-09 NOTE — Discharge Instructions (Signed)
Please call Dr. McDiarmid's office tomorrow for follow up appointment Your covid test results should be available through my chart in 24-48 hours Call Dr. McDiarmid or return if pain uncontrolled, unable to tolerate fluids, or fever.

## 2018-10-10 LAB — URINE CULTURE: Culture: NO GROWTH

## 2018-10-29 ENCOUNTER — Other Ambulatory Visit: Payer: Self-pay | Admitting: Urology

## 2018-11-11 ENCOUNTER — Encounter (HOSPITAL_BASED_OUTPATIENT_CLINIC_OR_DEPARTMENT_OTHER): Admission: RE | Payer: Self-pay | Source: Home / Self Care

## 2018-11-11 ENCOUNTER — Ambulatory Visit (HOSPITAL_BASED_OUTPATIENT_CLINIC_OR_DEPARTMENT_OTHER): Admission: RE | Admit: 2018-11-11 | Payer: 59 | Source: Home / Self Care | Admitting: Urology

## 2018-11-11 SURGERY — CYSTOSCOPY/URETEROSCOPY/HOLMIUM LASER/STENT PLACEMENT
Anesthesia: General | Laterality: Left

## 2019-05-01 ENCOUNTER — Ambulatory Visit: Payer: 59 | Attending: Internal Medicine

## 2019-05-01 DIAGNOSIS — Z20822 Contact with and (suspected) exposure to covid-19: Secondary | ICD-10-CM

## 2019-05-02 LAB — NOVEL CORONAVIRUS, NAA: SARS-CoV-2, NAA: NOT DETECTED

## 2019-08-19 ENCOUNTER — Encounter: Payer: Self-pay | Admitting: Internal Medicine

## 2019-09-30 ENCOUNTER — Telehealth: Payer: Self-pay | Admitting: *Deleted

## 2019-09-30 NOTE — Telephone Encounter (Signed)
Attempted to reach pt again with no answer.  No message left.  Will send no show letter and cancel procedure.

## 2019-09-30 NOTE — Telephone Encounter (Signed)
Pt did not show up for PV appointment.   LM on VM for patient to call back to reschedule or upcoming procedure will be cancelled.

## 2019-10-14 ENCOUNTER — Encounter: Payer: 59 | Admitting: Internal Medicine

## 2019-10-28 ENCOUNTER — Other Ambulatory Visit: Payer: Self-pay

## 2019-10-28 ENCOUNTER — Encounter (HOSPITAL_COMMUNITY): Payer: Self-pay | Admitting: Emergency Medicine

## 2019-10-28 ENCOUNTER — Ambulatory Visit (HOSPITAL_COMMUNITY)
Admission: EM | Admit: 2019-10-28 | Discharge: 2019-10-28 | Disposition: A | Discharge status: Home / Self Care | Payer: Medicare Other | Attending: Family Medicine | Admitting: Family Medicine

## 2019-10-28 DIAGNOSIS — J069 Acute upper respiratory infection, unspecified: Secondary | ICD-10-CM

## 2019-10-28 MED ORDER — DOXYCYCLINE HYCLATE 100 MG PO CAPS: 100.0000 mg | ORAL_CAPSULE | Freq: Two times a day (BID) | ORAL | 0 refills | Status: AC

## 2019-10-28 MED ORDER — BENZONATATE 200 MG PO CAPS: 200.0000 mg | ORAL_CAPSULE | Freq: Three times a day (TID) | ORAL | 0 refills | Status: AC | PRN

## 2019-10-28 MED ORDER — DM-GUAIFENESIN ER 30-600 MG PO TB12: 1.0000 | ORAL_TABLET | Freq: Two times a day (BID) | ORAL | 0 refills | Status: DC

## 2019-10-28 NOTE — Discharge Instructions (Signed)
Please restart using Flonase nasal spray, may also pair with daily cetirizine/Zyrtec or Claritin/loratadine to help with any postnasal drainage and congestion Tessalon/benzonatate every 8 hours as needed for cough May try Mucinex DM to further help with congestion and mucus Rest and drink plenty of fluids  May fill prescription for doxycycline to treat sinus infection on Friday if not having any improvement in symptoms with the use of the above over the next 3 to 4 days  Please follow-up if any symptoms not improving or worsening, developing increased difficulty breathing or shortness of breath

## 2019-10-28 NOTE — ED Provider Notes (Signed)
[facility]    CSN: [id number] Arrival date & time: 10/28/19  1143      History   Chief Complaint Chief Complaint  Patient presents with  . Nasal Congestion  . Cough    HPI Edgar Clark is a 64 y.o. male presenting today for evaluation of URI symptoms.  Patient reports for the past 3 days he has had sinus congestion cough and right ear fullness.  Symptoms have been worsening and has developed increased pressure in his sinuses.  He expresses concern of this turning into bronchitis or sinusitis.  He denies history of asthma, tobacco use or any lung problems.  Denies fevers.  Reports being vaccinated.  Denies GI symptoms.  Using Benadryl and Zicam without relief.  HPI  Past Medical History:  Diagnosis Date  . Allergy     There are no problems to display for this patient.   Past Surgical History:  Procedure Laterality Date  . SEPTOPLASTY    . SHOULDER ARTHROSCOPY Bilateral        Home Medications    Prior to Admission medications   Medication Sig Start Date End Date Taking? Authorizing Provider  atorvastatin (LIPITOR) 40 MG tablet Take 10 mg by mouth daily at 6 PM.    Yes [provider]  benzonatate (TESSALON) 200 MG capsule Take 1 capsule (200 mg total) by mouth 3 (three) times daily as needed for up to 7 days for cough. 10/28/19 11/04/19  Shanece Cochrane C, PA-C  cephALEXin (KEFLEX) 500 MG capsule Take 1 capsule (500 mg total) by mouth 4 (four) times daily. 10/09/18   Pattricia Boss, MD  dextromethorphan-guaiFENesin [facility] DM) 30-600 MG 12hr tablet Take 1 tablet by mouth 2 (two) times daily. 10/28/19   Grayson White C, PA-C  doxycycline (VIBRAMYCIN) 100 MG capsule Take 1 capsule (100 mg total) by mouth 2 (two) times daily for 10 days. 10/31/19 11/10/19  Jceon Alverio C, PA-C  ondansetron (ZOFRAN ODT) 8 MG disintegrating tablet Take 1 tablet (8 mg total) by mouth every 8 (eight) hours as needed for nausea or vomiting. 10/09/18   Pattricia Boss, MD    oxyCODONE-acetaminophen (PERCOCET/ROXICET) 5-325 MG tablet Take 2 tablets by mouth every 4 (four) hours as needed for severe pain. 10/09/18   Pattricia Boss, MD    Family History Family History  Problem Relation Age of Onset  . Heart failure Father   . Heart attack Brother     Social History Social History   Tobacco Use  . Smoking status: Never Smoker  . Smokeless tobacco: Never Used  Substance Use Topics  . Alcohol use: Yes    Alcohol/week: 0.0 standard drinks    Comment: occ  . Drug use: Never     Allergies   Patient has no known allergies.   Review of Systems Review of Systems  Constitutional: Positive for fatigue. Negative for activity change, appetite change, chills and fever.  HENT: Positive for congestion, ear pain, rhinorrhea and sinus pressure. Negative for sore throat and trouble swallowing.   Eyes: Negative for discharge and redness.  Respiratory: Positive for cough. Negative for chest tightness and shortness of breath.   Cardiovascular: Negative for chest pain.  Gastrointestinal: Negative for abdominal pain, diarrhea, nausea and vomiting.  Musculoskeletal: Negative for myalgias.  Skin: Negative for rash.  Neurological: Negative for dizziness, light-headedness and headaches.     Physical Exam Triage Vital Signs ED Triage Vitals  Enc Vitals Group     BP 10/28/19 1249 (!) 130/102  Pulse Rate 10/28/19 1249 79     Resp 10/28/19 1249 16     Temp 10/28/19 1249 98.5 F (36.9 C)     Temp Source 10/28/19 1249 Oral     SpO2 10/28/19 1249 99 %     Weight --      Height --      Head Circumference --      Peak Flow --      Pain Score 10/28/19 1247 7     Pain Loc --      Pain Edu? --      Excl. in [place]? --    No data found.  Updated Vital Signs BP (!) 130/102   Pulse 79   Temp 98.5 F (36.9 C) (Oral)   Resp 16   SpO2 99%   Visual Acuity Right Eye Distance:   Left Eye Distance:   Bilateral Distance:    Right Eye Near:   Left Eye Near:     Bilateral Near:     Physical Exam Vitals and nursing note reviewed.  Constitutional:      Appearance: He is well-developed.     Comments: No acute distress  HENT:     Head: Normocephalic and atraumatic.     Ears:     Comments: Bilateral ears without tenderness to palpation of external auricle, tragus and mastoid, EAC's without erythema or swelling, TM's with good bony landmarks and cone of light. Non erythematous.     Nose: Nose normal.     Comments: Nasal mucosa erythematous, mildly swollen turbinates bilaterally    Mouth/Throat:     Comments: Oral mucosa pink and moist, no tonsillar enlargement or exudate. Posterior pharynx patent and nonerythematous, no uvula deviation or swelling. Normal phonation.  Eyes:     Conjunctiva/sclera: Conjunctivae normal.  Cardiovascular:     Comments: Heart rate irregular Pulmonary:     Effort: Pulmonary effort is normal. No respiratory distress.     Comments: Breathing comfortably at rest, CTABL, no wheezing, rales or other adventitious sounds auscultated Abdominal:     General: There is no distension.  Musculoskeletal:        General: Normal range of motion.     Cervical back: Neck supple.  Skin:    General: Skin is warm and dry.  Neurological:     Mental Status: He is alert and oriented to person, place, and time.      UC Treatments / Results  Labs (all labs ordered are listed, but only abnormal results are displayed) Labs Reviewed - No data to display  EKG   Radiology No results found.  Procedures Procedures (including critical care time)  Medications Ordered in UC Medications - No data to display  Initial Impression / Assessment and Plan / UC Course  I have reviewed the triage vital signs and the nursing notes.  Pertinent labs & imaging results that were available during my care of the patient were reviewed by me and considered in my medical decision making (see chart for details).     EKG sinus rhythm with  first-degree AV block, occasional PVCs, currently asymptomatic from this but is new, recommended to follow-up with PCP/cardiology for further monitoring.  URI symptoms x3 days, patient declined Covid testing.  Likely viral etiology and recommend symptomatic and supportive care.  Did provide a prescription for doxycycline to fill on Friday if not having any improvement with recommended symptomatic management over the next 3 to 4 days.  Rest and fluids.  Discussed strict return  precautions. Patient verbalized understanding and is agreeable with plan.  Final Clinical Impressions(s) / UC Diagnoses   Final diagnoses:  Viral URI with cough     Discharge Instructions     Please restart using Flonase nasal spray, may also pair with daily cetirizine/Zyrtec or Claritin/loratadine to help with any postnasal drainage and congestion Tessalon/benzonatate every 8 hours as needed for cough May try Mucinex DM to further help with congestion and mucus Rest and drink plenty of fluids  May fill prescription for doxycycline to treat sinus infection on Friday if not having any improvement in symptoms with the use of the above over the next 3 to 4 days  Please follow-up if any symptoms not improving or worsening, developing increased difficulty breathing or shortness of breath   ED Prescriptions    Medication Sig Dispense Auth. Provider   benzonatate (TESSALON) 200 MG capsule Take 1 capsule (200 mg total) by mouth 3 (three) times daily as needed for up to 7 days for cough. 28 capsule Chaeli Judy C, PA-C   dextromethorphan-guaiFENesin (MUCINEX DM) 30-600 MG 12hr tablet Take 1 tablet by mouth 2 (two) times daily. 20 tablet Millette Halberstam C, PA-C   doxycycline (VIBRAMYCIN) 100 MG capsule Take 1 capsule (100 mg total) by mouth 2 (two) times daily for 10 days. 20 capsule Mouna Yager, [place] C, PA-C     PDMP not reviewed this encounter.   Janith Lima, [place] 10/28/19 1333

## 2019-10-28 NOTE — ED Triage Notes (Signed)
PT reports sinus congestion, cough, right ear fullness, and chest congestion for 3 days  Has had COVID vaccine x2, denies testing today.

## 2019-11-27 ENCOUNTER — Ambulatory Visit (AMBULATORY_SURGERY_CENTER): Payer: Self-pay | Admitting: *Deleted

## 2019-11-27 ENCOUNTER — Other Ambulatory Visit: Payer: Self-pay

## 2019-11-27 VITALS — Ht 75.0 in | Wt 209.0 lb

## 2019-11-27 DIAGNOSIS — Z86010 Personal history of colonic polyps: Secondary | ICD-10-CM

## 2019-11-27 MED ORDER — SUTAB 1479-225-188 MG PO TABS: 24.0000 | ORAL_TABLET | ORAL | 0 refills | Status: DC

## 2019-11-27 NOTE — Progress Notes (Signed)

## 2019-12-11 ENCOUNTER — Ambulatory Visit (AMBULATORY_SURGERY_CENTER): Payer: Medicare Other | Admitting: Internal Medicine

## 2019-12-11 ENCOUNTER — Encounter: Payer: Self-pay | Admitting: Internal Medicine

## 2019-12-11 ENCOUNTER — Other Ambulatory Visit: Payer: Self-pay

## 2019-12-11 VITALS — BP 117/87 | HR 70 | Temp 97.2°F | Resp 14 | Ht 75.0 in | Wt 209.0 lb

## 2019-12-11 DIAGNOSIS — D125 Benign neoplasm of sigmoid colon: Secondary | ICD-10-CM

## 2019-12-11 DIAGNOSIS — Z86010 Personal history of colonic polyps: Secondary | ICD-10-CM

## 2019-12-11 DIAGNOSIS — K635 Polyp of colon: Secondary | ICD-10-CM | POA: Diagnosis not present

## 2019-12-11 MED ORDER — SODIUM CHLORIDE 0.9 % IV SOLN: 500.0000 mL | Freq: Once | INTRAVENOUS | Status: DC

## 2019-12-11 NOTE — Progress Notes (Signed)
VS- Edgar Clark 

## 2019-12-11 NOTE — Progress Notes (Signed)
Report to PACU, RN, vss, BBS= Clear.  

## 2019-12-11 NOTE — Progress Notes (Signed)
Called to room to assist during endoscopic procedure.  Patient ID and intended procedure confirmed with present staff. Received instructions for my participation in the procedure from the performing physician.  

## 2019-12-11 NOTE — Op Note (Signed)
[facility] Patient Name: Edgar Clark Procedure Date: 12/11/2019 2:21 PM MRN: [id number] Endoscopist: Docia Chuck. Edgar Clark , MD Age: 64 Referring MD:  Date of Birth: 12/16/1955 Gender: Male Account #: [id number] Procedure:                Colonoscopy with cold snare polypectomy x 1 Indications:              High risk colon cancer surveillance: Personal                            history of non-advanced adenoma, High risk colon                            cancer surveillance: Personal history of sessile                            serrated colon polyp (less than 10 mm in size) with                            no dysplasia. Previous examinations 2001 and 2006                            both negative for neoplasia. Last examination 2016                            with 3 small SSP's and a diminutive adenoma. Medicines:                Monitored Anesthesia Care Procedure:                Pre-Anesthesia Assessment:                           - Prior to the procedure, a History and Physical                            was performed, and patient medications and                            allergies were reviewed. The patient's tolerance of                            previous anesthesia was also reviewed. The risks                            and benefits of the procedure and the sedation                            options and risks were discussed with the patient.                            All questions were answered, and informed consent                            was obtained. Prior Anticoagulants: The patient has  taken no previous anticoagulant or antiplatelet                            agents. ASA Grade Assessment: II - A patient with                            mild systemic disease. After reviewing the risks                            and benefits, the patient was deemed in                            satisfactory condition to undergo the procedure.                            After obtaining informed consent, the colonoscope                            was passed under direct vision. Throughout the                            procedure, the patient's blood pressure, pulse, and                            oxygen saturations were monitored continuously. The                            Colonoscope was introduced through the anus and                            advanced to the the cecum, identified by                            appendiceal orifice and ileocecal valve. The                            ileocecal valve, appendiceal orifice, and rectum                            were photographed. The quality of the bowel                            preparation was excellent. The colonoscopy was                            performed without difficulty. The patient tolerated                            the procedure well. The bowel preparation used was                            SUPREP via split dose instruction. Scope In: 2:32:19 PM Scope Out: 2:51:48 PM Scope Withdrawal Time: 0 hours 13 minutes 16 seconds  Total Procedure Duration: 0  hours 19 minutes 29 seconds  Findings:                 A 4 mm polyp was found in the sigmoid colon. The                            polyp was removed with a cold snare. Resection and                            retrieval were complete.                           Multiple diverticula were found in the sigmoid                            colon and right colon.                           Internal hemorrhoids were found during                            retroflexion. The hemorrhoids were small.                           The exam was otherwise without abnormality on                            direct and retroflexion views. Complications:            No immediate complications. Estimated blood loss:                            None. Estimated Blood Loss:     Estimated blood loss: none. Impression:               - One 4 mm polyp in the sigmoid colon,  removed with                            a cold snare. Resected and retrieved.                           - Diverticulosis in the sigmoid colon and in the                            right colon.                           - Internal hemorrhoids.                           - The examination was otherwise normal on direct                            and retroflexion views. Recommendation:           - Repeat colonoscopy in 5-10 years for  surveillance, based on final pathology.                           - Patient has a contact number available for                            emergencies. The signs and symptoms of potential                            delayed complications were discussed with the                            patient. Return to normal activities tomorrow.                            Written discharge instructions were provided to the                            patient.                           - Resume previous diet.                           - Continue present medications.                           - Await pathology results. Docia Chuck. Edgar Pastor, MD 12/11/2019 3:02:34 PM This report has been signed electronically.

## 2019-12-11 NOTE — Patient Instructions (Signed)
Handout on polyps and diverticulosis given    YOU HAD AN ENDOSCOPIC PROCEDURE TODAY AT THE [facility]:   Refer to the procedure report that was given to you for any specific questions about what was found during the examination.  If the procedure report does not answer your questions, please call your gastroenterologist to clarify.  If you requested that your care partner not be given the details of your procedure findings, then the procedure report has been included in a sealed envelope for you to review at your convenience later.  YOU SHOULD EXPECT: Some feelings of bloating in the abdomen. Passage of more gas than usual.  Walking can help get rid of the air that was put into your GI tract during the procedure and reduce the bloating. If you had a lower endoscopy (such as a colonoscopy or flexible sigmoidoscopy) you may notice spotting of blood in your stool or on the toilet paper. If you underwent a bowel prep for your procedure, you may not have a normal bowel movement for a few days.  Please Note:  You might notice some irritation and congestion in your nose or some drainage.  This is from the oxygen used during your procedure.  There is no need for concern and it should clear up in a day or so.  SYMPTOMS TO REPORT IMMEDIATELY:   Following lower endoscopy (colonoscopy or flexible sigmoidoscopy):  Excessive amounts of blood in the stool  Significant tenderness or worsening of abdominal pains  Swelling of the abdomen that is new, acute  Fever of 100F or higher   For urgent or emergent issues, a gastroenterologist can be reached at any hour by calling [phone]. Do not use MyChart messaging for urgent concerns.    DIET:  We do recommend a small meal at first, but then you may proceed to your regular diet.  Drink plenty of fluids but you should avoid alcoholic beverages for 24 hours.  ACTIVITY:  You should plan to take it easy for the rest of today and you should NOT  DRIVE or use heavy machinery until tomorrow (because of the sedation medicines used during the test).    FOLLOW UP: Our staff will call the number listed on your records 48-72 hours following your procedure to check on you and address any questions or concerns that you may have regarding the information given to you following your procedure. If we do not reach you, we will leave a message.  We will attempt to reach you two times.  During this call, we will ask if you have developed any symptoms of COVID 19. If you develop any symptoms (ie: fever, flu-like symptoms, shortness of breath, cough etc.) before then, please call [phone].  If you test positive for Covid 19 in the 2 weeks post procedure, please call and report this information to us.    If any biopsies were taken you will be contacted by phone or by letter within the next 1-3 weeks.  Please call us at [phone] if you have not heard about the biopsies in 3 weeks.    SIGNATURES/CONFIDENTIALITY: You and/or your care partner have signed paperwork which will be entered into your electronic medical record.  These signatures attest to the fact that that the information above on your After Visit Summary has been reviewed and is understood.  Full responsibility of the confidentiality of this discharge information lies with you and/or your care-partner. 

## 2019-12-16 ENCOUNTER — Telehealth: Payer: Self-pay | Admitting: *Deleted

## 2019-12-16 ENCOUNTER — Encounter: Payer: Self-pay | Admitting: Internal Medicine

## 2019-12-16 NOTE — Telephone Encounter (Signed)
°  Follow up Call-  Call back number 12/11/2019  Post procedure Call Back phone  # [phone]  Permission to leave phone message Yes  Some recent data might be hidden     Patient questions:  Do you have a fever, pain , or abdominal swelling? No. Pain Score  0 *  Have you tolerated food without any problems? Yes.    Have you been able to return to your normal activities? Yes.    Do you have any questions about your discharge instructions: Diet   No. Medications  No. Follow up visit  No.  Do you have questions or concerns about your Care? No.  Actions: * If pain score is 4 or above: No action needed, pain <4.  1. Have you developed a fever since your procedure? no  2.   Have you had an respiratory symptoms (SOB or cough) since your procedure? no  3.   Have you tested positive for COVID 19 since your procedure no  4.   Have you had any family members/close contacts diagnosed with the COVID 19 since your procedure?  no   If yes to any of these questions please route to Joylene John, RN and Joella Prince, RN

## 2020-01-06 ENCOUNTER — Telehealth: Payer: Self-pay

## 2020-01-06 MED ORDER — CIPROFLOXACIN HCL 500 MG PO TABS: ORAL_TABLET | ORAL | 0 refills | Status: DC

## 2020-01-06 NOTE — Telephone Encounter (Signed)
-----   Message from Irene Shipper, MD sent at 01/06/2020  2:36 PM EDT ----- Regarding: Prescription for possible traveler's diarrhea Vaughan Basta, Please call the following prescription into [facility] on [address], for Mr. Blasdell.Ciprofloxacin 500 mg; #12; take 1 twice daily for 3 days for moderate to severe diarrhea.  No refills.Thanks,Dr. [name]

## 2020-01-21 DIAGNOSIS — H43822 Vitreomacular adhesion, left eye: Secondary | ICD-10-CM | POA: Insufficient documentation

## 2020-01-21 DIAGNOSIS — H43811 Vitreous degeneration, right eye: Secondary | ICD-10-CM | POA: Insufficient documentation

## 2020-01-29 ENCOUNTER — Other Ambulatory Visit: Payer: Self-pay

## 2020-01-29 ENCOUNTER — Ambulatory Visit (INDEPENDENT_AMBULATORY_CARE_PROVIDER_SITE_OTHER): Payer: Medicare Other | Admitting: Ophthalmology

## 2020-01-29 ENCOUNTER — Encounter (INDEPENDENT_AMBULATORY_CARE_PROVIDER_SITE_OTHER): Payer: Self-pay | Admitting: Ophthalmology

## 2020-01-29 DIAGNOSIS — H43822 Vitreomacular adhesion, left eye: Secondary | ICD-10-CM | POA: Diagnosis not present

## 2020-01-29 DIAGNOSIS — H43811 Vitreous degeneration, right eye: Secondary | ICD-10-CM | POA: Diagnosis not present

## 2020-01-29 DIAGNOSIS — H2513 Age-related nuclear cataract, bilateral: Secondary | ICD-10-CM | POA: Insufficient documentation

## 2020-01-29 NOTE — Assessment & Plan Note (Signed)

## 2020-01-29 NOTE — Assessment & Plan Note (Signed)
Vitreomacular traction may cause vision loss from anatomic distortion to the center of the vision, the macula.  If visual function is symptomatic or threatened, therapy may be needed.  Surgical intervention offers the highest chance of visual stability and improvement.  Distortion of the macula anatomy may cause splitting of the retinal layers, termed foveomacular retinoschisis, which can cause more permanent vision loss.  Epiretinal membranes may also be associated.  Macular hole may also develop if vitreomacular traction progresses. The minor form of this condition is Vitreomacular adhesion, which is a natural change in the aging process of the eye, which requires observation only.  OS, with no distortion.  Some discussion regarding likely impending posterior vitreous detachment with its attendant symptoms reviewed with the patient.  He is asked to seek attention and evaluation with Dr. Gershon Crane or myself upon the first workday unless profound difficulties arise

## 2020-01-29 NOTE — Assessment & Plan Note (Signed)

## 2020-01-29 NOTE — Patient Instructions (Signed)
Patient instructed to contact this office or Dr. Rutherford Guys promptly should new onset symptoms develop in either eye.

## 2020-01-29 NOTE — Progress Notes (Signed)
01/29/2020     CHIEF COMPLAINT Patient presents for Retina Follow Up   HISTORY OF PRESENT ILLNESS: Edgar Clark is a 64 y.o. male who presents to the clinic today for:   HPI    Retina Follow Up    Patient presents with  PVD.  In right eye.  Severity is moderate.  Duration of 2 years.  Since onset it is stable.  I, the attending physician,  performed the HPI with the patient and updated documentation appropriately.          Comments    2 Year PVD f\u. OCT  Pt states no changes in vision. Denies any complaints.       Last edited by Tilda Franco on 01/29/2020  8:50 AM. (History)      Referring physician: Lorene Dy, MD [address] [place],  [place] [address]  HISTORICAL INFORMATION:   Selected notes from the [facility]: No current outpatient medications on file. (Ophthalmic Drugs)   No current facility-administered medications for this visit. (Ophthalmic Drugs)   Current Outpatient Medications (Other)  Medication Sig  . atorvastatin (LIPITOR) 40 MG tablet Take 10 mg by mouth daily at 6 PM.   . cephALEXin (KEFLEX) 500 MG capsule Take 1 capsule (500 mg total) by mouth 4 (four) times daily. (Patient not taking: Reported on 12/11/2019)  . ciprofloxacin (CIPRO) 500 MG tablet Take 1 tab twice daily for 3 days for diarrhea  . dextromethorphan-guaiFENesin (MUCINEX DM) 30-600 MG 12hr tablet Take 1 tablet by mouth 2 (two) times daily. (Patient not taking: Reported on 12/11/2019)  . ondansetron (ZOFRAN ODT) 8 MG disintegrating tablet Take 1 tablet (8 mg total) by mouth every 8 (eight) hours as needed for nausea or vomiting. (Patient not taking: Reported on 12/11/2019)  . oxyCODONE-acetaminophen (PERCOCET/ROXICET) 5-325 MG tablet Take 2 tablets by mouth every 4 (four) hours as needed for severe pain. (Patient not taking: Reported on 12/11/2019)  . PARoxetine (PAXIL) 20 MG tablet Take 20 mg by mouth daily.  . tamsulosin (FLOMAX) 0.4 MG  CAPS capsule Take 0.4 mg by mouth daily. (Patient not taking: Reported on 12/11/2019)   No current facility-administered medications for this visit. (Other)      REVIEW OF SYSTEMS:    ALLERGIES No Known Allergies  PAST MEDICAL HISTORY Past Medical History:  Diagnosis Date  . Allergy   . Chronic kidney disease    kidney stones   Past Surgical History:  Procedure Laterality Date  . COLONOSCOPY    . POLYPECTOMY    . SEPTOPLASTY    . SHOULDER ARTHROSCOPY Bilateral     FAMILY HISTORY Family History  Problem Relation Age of Onset  . Heart failure Father   . Heart attack Brother   . Colon cancer Neg Hx   . Colon polyps Neg Hx   . Esophageal cancer Neg Hx   . Rectal cancer Neg Hx   . Stomach cancer Neg Hx     SOCIAL HISTORY Social History   Tobacco Use  . Smoking status: Never Smoker  . Smokeless tobacco: Never Used  Substance Use Topics  . Alcohol use: Yes    Alcohol/week: 0.0 standard drinks    Comment: occ  . Drug use: Never         OPHTHALMIC EXAM: Base Eye Exam    Visual Acuity (Snellen - Linear)      Right Left   Dist Scissors 20/40 -1 20/20 -  1   Dist ph [place] 20/25        Tonometry (Tonopen, 8:53 AM)      Right Left   Pressure 12 13       Pupils      Pupils Dark Light Shape React APD   Right PERRL 3 2 Round Brisk None   Left PERRL 3 2 Round Brisk None       Visual Fields (Counting fingers)      Left Right    Full Full       Neuro/Psych    Oriented x3: Yes   Mood/Affect: Normal       Dilation    Both eyes: 1.0% Mydriacyl, 2.5% Phenylephrine @ 8:53 AM        Slit Lamp and Fundus Exam    External Exam      Right Left   External Normal Normal       Slit Lamp Exam      Right Left   Lids/Lashes Normal Normal   Conjunctiva/Sclera White and quiet White and quiet   Cornea Clear Clear   Anterior Chamber Deep and quiet Deep and quiet   Iris Round and reactive Round and reactive   Lens 1+ Nuclear sclerosis 1+ Nuclear sclerosis    Anterior Vitreous Normal Normal       Fundus Exam      Right Left   Posterior Vitreous Posterior vitreous detachment Posterior vitreous detachment   Disc Normal Normal   C/D Ratio 0.2 0.2   Macula Normal Normal   Vessels Normal Normal   Periphery Normal, no retinal holes or tears Normal, no retinal holes or tears          IMAGING AND PROCEDURES  Imaging and Procedures for 01/29/20  OCT, Retina - OU - Both Eyes       Right Eye Quality was good. Scan locations included subfoveal. Central Foveal Thickness: 294. Progression has been stable. Findings include normal foveal contour.   Left Eye Quality was good. Scan locations included subfoveal. Central Foveal Thickness: 306. Progression has been stable. Findings include vitreomacular adhesion , normal foveal contour.   Notes Incidental posterior vitreous detachment OD,   Vitreal macular adhesion OS with no tractional inner retinal distortion                ASSESSMENT/PLAN:  Vitreomacular adhesion of left eye Vitreomacular traction may cause vision loss from anatomic distortion to the center of the vision, the macula.  If visual function is symptomatic or threatened, therapy may be needed.  Surgical intervention offers the highest chance of visual stability and improvement.  Distortion of the macula anatomy may cause splitting of the retinal layers, termed foveomacular retinoschisis, which can cause more permanent vision loss.  Epiretinal membranes may also be associated.  Macular hole may also develop if vitreomacular traction progresses. The minor form of this condition is Vitreomacular adhesion, which is a natural change in the aging process of the eye, which requires observation only.  OS, with no distortion.  Some discussion regarding likely impending posterior vitreous detachment with its attendant symptoms reviewed with the patient.  He is asked to seek attention and evaluation with Dr. Gershon Crane or myself upon the first  workday unless profound difficulties arise  Posterior vitreous detachment of right eye   The nature of posterior vitreous detachment was discussed with the patient as well as its physiology, its age prevalence, and its possible implication regarding retinal breaks and detachment.  An informational brochure was  given to the patient.  All the patient's questions were answered.  The patient was asked to return if new or different flashes or floaters develops.   Patient was instructed to contact office immediately if any changes were noticed. I explained to the patient that vitreous inside the eye is similar to jello inside a bowl. As the jello melts it can start to pull away from the bowl, similarly the vitreous throughout our lives can begin to pull away from the retina. That process is called a posterior vitreous detachment. In some cases, the vitreous can tug hard enough on the retina to form a retinal tear. I discussed with the patient the signs and symptoms of a retinal detachment.  Do not rub the eye.  Nuclear sclerotic cataract of both eyes The nature of cataract was discussed with the patient as well as the elective nature of surgery. The patient was reassured that surgery at a later date does not put the patient at risk for a worse outcome. It was emphasized that the need for surgery is dictated by the patient's quality of life as influenced by the cataract. Patient was instructed to maintain close follow up with their general eye care doctor.      ICD-10-CM   1. Posterior vitreous detachment of right eye  H43.811 OCT, Retina - OU - Both Eyes  2. Vitreomacular adhesion of left eye  H43.822 OCT, Retina - OU - Both Eyes  3. Nuclear sclerotic cataract of both eyes  H25.13     1.  No retinal holes or tears OU  2.  No foveal distortion OS  3.  Minor cataract in each eye, age-appropriate and not visually significant at this time.  Ophthalmic Meds Ordered this visit:  No orders of the defined  types were placed in this encounter.      Return if symptoms worsen or fail to improve, for And follow-up with Dr. Rutherford Guys as scheduled.  Patient Instructions  Patient instructed to contact this office or Dr. Rutherford Guys promptly should new onset symptoms develop in either eye.    Explained the diagnoses, plan, and follow up with the patient and they expressed understanding.  Patient expressed understanding of the importance of proper follow up care.   Edgar Clark M.D. Diseases & Surgery of the Retina and Vitreous [facility] 01/29/20     Abbreviations: M myopia (nearsighted); A astigmatism; H hyperopia (farsighted); P presbyopia; Mrx spectacle prescription;  CTL contact lenses; OD right eye; OS left eye; OU both eyes  XT exotropia; ET esotropia; PEK punctate epithelial keratitis; PEE punctate epithelial erosions; DES dry eye syndrome; MGD meibomian gland dysfunction; ATs artificial tears; PFAT's preservative free artificial tears; [place] nuclear sclerotic cataract; PSC posterior subcapsular cataract; ERM epi-retinal membrane; PVD posterior vitreous detachment; RD retinal detachment; DM diabetes mellitus; DR diabetic retinopathy; NPDR non-proliferative diabetic retinopathy; PDR proliferative diabetic retinopathy; CSME clinically significant macular edema; DME diabetic macular edema; dbh dot blot hemorrhages; CWS cotton wool spot; POAG primary open angle glaucoma; C/D cup-to-disc ratio; HVF humphrey visual field; GVF goldmann visual field; OCT optical coherence tomography; IOP intraocular pressure; BRVO Branch retinal vein occlusion; CRVO central retinal vein occlusion; CRAO central retinal artery occlusion; BRAO branch retinal artery occlusion; RT retinal tear; SB scleral buckle; PPV pars plana vitrectomy; VH Vitreous hemorrhage; PRP panretinal laser photocoagulation; IVK intravitreal kenalog; VMT vitreomacular traction; MH Macular hole;  NVD neovascularization of the  disc; NVE neovascularization elsewhere; AREDS age related eye disease study;  ARMD age related macular degeneration; POAG primary open angle glaucoma; EBMD epithelial/anterior basement membrane dystrophy; ACIOL anterior chamber intraocular lens; IOL intraocular lens; PCIOL posterior chamber intraocular lens; Phaco/IOL phacoemulsification with intraocular lens placement; [place] photorefractive keratectomy; LASIK laser assisted in situ keratomileusis; HTN hypertension; DM diabetes mellitus; COPD chronic obstructive pulmonary disease

## 2020-09-17 ENCOUNTER — Other Ambulatory Visit: Payer: Self-pay | Admitting: Urology

## 2020-09-17 DIAGNOSIS — N201 Calculus of ureter: Secondary | ICD-10-CM

## 2020-10-07 NOTE — Progress Notes (Signed)
Left message to call back  

## 2020-10-07 NOTE — Progress Notes (Signed)
Patient called back instructions given.Arrival time 0600 NPO after MN except to take meds driver is wife. If any fevers or s/s noted before Thursday call back to surgery center

## 2020-10-13 NOTE — H&P (Signed)
H&P  Chief Complaint: Left sided ureteral stone  History of Present Illness: 65 yo male presents for ESL of a non-progressing 7 mm left ureteral stone.  Past Medical History:  Diagnosis Date   Allergy    Chronic kidney disease    kidney stones    Past Surgical History:  Procedure Laterality Date   COLONOSCOPY     POLYPECTOMY     SEPTOPLASTY     SHOULDER ARTHROSCOPY Bilateral     Home Medications:  Allergies as of 10/13/2020   No Known Allergies      Medication List      Notice   Cannot display discharge medications because the patient has not yet been admitted.     Allergies: No Known Allergies  Family History  Problem Relation Age of Onset   Heart failure Father    Heart attack Brother    Colon cancer Neg Hx    Colon polyps Neg Hx    Esophageal cancer Neg Hx    Rectal cancer Neg Hx    Stomach cancer Neg Hx     Social History:  reports that he has never smoked. He has never used smokeless tobacco. He reports current alcohol use. He reports that he does not use drugs.  ROS: A complete review of systems was performed.  All systems are negative except for pertinent findings as noted.  Physical Exam:  Vital signs in last 24 hours: Ht 6\' 3"  (1.905 m)   BMI 26.12 kg/m  Constitutional:  Alert and oriented, No acute distress Cardiovascular: Regular rate  Respiratory: Normal respiratory effort Neurologic: Grossly intact, no focal deficits Psychiatric: Normal mood and affect  I have reviewed prior pt note  I have reviewed urinalysis results  I have independently reviewed prior imaging    Impression/Assessment:  Left ureteral stone  Plan:  ESL

## 2020-10-14 ENCOUNTER — Other Ambulatory Visit: Payer: Self-pay

## 2020-10-14 ENCOUNTER — Encounter (HOSPITAL_BASED_OUTPATIENT_CLINIC_OR_DEPARTMENT_OTHER)
Admission: RE | Disposition: A | Discharge status: Home / Self Care | Payer: Self-pay | Source: Home / Self Care | Attending: Urology

## 2020-10-14 ENCOUNTER — Ambulatory Visit (HOSPITAL_COMMUNITY): Payer: Medicare Other

## 2020-10-14 ENCOUNTER — Encounter (HOSPITAL_BASED_OUTPATIENT_CLINIC_OR_DEPARTMENT_OTHER): Payer: Self-pay | Admitting: Urology

## 2020-10-14 ENCOUNTER — Ambulatory Visit (HOSPITAL_BASED_OUTPATIENT_CLINIC_OR_DEPARTMENT_OTHER)
Admission: RE | Admit: 2020-10-14 | Discharge: 2020-10-14 | Disposition: A | Discharge status: Home / Self Care | Payer: Medicare Other | Attending: Urology | Admitting: Urology

## 2020-10-14 DIAGNOSIS — Z8249 Family history of ischemic heart disease and other diseases of the circulatory system: Secondary | ICD-10-CM | POA: Insufficient documentation

## 2020-10-14 DIAGNOSIS — N201 Calculus of ureter: Secondary | ICD-10-CM

## 2020-10-14 HISTORY — PX: EXTRACORPOREAL SHOCK WAVE LITHOTRIPSY: SHX1557

## 2020-10-14 SURGERY — LITHOTRIPSY, ESWL
Anesthesia: LOCAL | Laterality: Left

## 2020-10-14 MED ORDER — DIAZEPAM 5 MG PO TABS
ORAL_TABLET | ORAL | Status: AC
  Filled 2020-10-14: qty 2

## 2020-10-14 MED ORDER — DIAZEPAM 5 MG PO TABS
10.0000 mg | ORAL_TABLET | ORAL | Status: AC
  Administered 2020-10-14: 10 mg via ORAL

## 2020-10-14 MED ORDER — CIPROFLOXACIN HCL 500 MG PO TABS
500.0000 mg | ORAL_TABLET | ORAL | Status: AC
  Administered 2020-10-14: 500 mg via ORAL

## 2020-10-14 MED ORDER — CIPROFLOXACIN HCL 500 MG PO TABS
ORAL_TABLET | ORAL | Status: AC
  Filled 2020-10-14: qty 1

## 2020-10-14 MED ORDER — OXYCODONE HCL 5 MG PO TABS: ORAL_TABLET | ORAL | 0 refills | Status: DC

## 2020-10-14 MED ORDER — SODIUM CHLORIDE 0.9 % IV SOLN: INTRAVENOUS | Status: DC

## 2020-10-14 MED ORDER — DIPHENHYDRAMINE HCL 25 MG PO CAPS
ORAL_CAPSULE | ORAL | Status: AC
  Filled 2020-10-14: qty 1

## 2020-10-14 MED ORDER — DIPHENHYDRAMINE HCL 25 MG PO CAPS
25.0000 mg | ORAL_CAPSULE | ORAL | Status: AC
  Administered 2020-10-14: 25 mg via ORAL

## 2020-10-14 MED ORDER — KETOROLAC TROMETHAMINE 60 MG/2ML IM SOLN: 60.0000 mg | Freq: Once | INTRAMUSCULAR | Status: AC

## 2020-10-14 NOTE — Discharge Instructions (Signed)
See [facility] discharge instructions in chart.  

## 2020-10-14 NOTE — Op Note (Signed)
See [facility] note scanned into chart. 

## 2020-10-14 NOTE — Interval H&P Note (Signed)
History and Physical Interval Note:  10/14/2020 7:54 AM  Edgar Clark  has presented today for surgery, with the diagnosis of LEFT DISTAL URETERAL STONE.  The various methods of treatment have been discussed with the patient and family. After consideration of risks, benefits and other options for treatment, the patient has consented to  Procedure(s): EXTRACORPOREAL SHOCK WAVE LITHOTRIPSY (ESWL) (Left) as a surgical intervention.  The patient's history has been reviewed, patient examined, no change in status, stable for surgery.  I have reviewed the patient's chart and labs.  Questions were answered to the patient's satisfaction.     Lillette Boxer [name]

## 2020-10-15 ENCOUNTER — Encounter (HOSPITAL_BASED_OUTPATIENT_CLINIC_OR_DEPARTMENT_OTHER): Payer: Self-pay | Admitting: Urology

## 2021-08-23 ENCOUNTER — Ambulatory Visit (INDEPENDENT_AMBULATORY_CARE_PROVIDER_SITE_OTHER): Payer: Medicare Other

## 2021-08-23 ENCOUNTER — Ambulatory Visit: Payer: Medicare Other | Admitting: Podiatry

## 2021-08-23 DIAGNOSIS — B353 Tinea pedis: Secondary | ICD-10-CM

## 2021-08-23 DIAGNOSIS — M21611 Bunion of right foot: Secondary | ICD-10-CM | POA: Diagnosis not present

## 2021-08-23 DIAGNOSIS — B49 Unspecified mycosis: Secondary | ICD-10-CM | POA: Diagnosis not present

## 2021-08-23 MED ORDER — TERBINAFINE HCL 250 MG PO TABS: 250.0000 mg | ORAL_TABLET | Freq: Every day | ORAL | 0 refills | Status: DC

## 2021-08-23 MED ORDER — CICLOPIROX 0.77 % EX GEL: 1.0000 "application " | Freq: Two times a day (BID) | CUTANEOUS | 0 refills | Status: DC

## 2021-08-23 NOTE — Patient Instructions (Signed)
Start the oral Lamisil.  If you have any side effects please stop this and let me know.  Ciclopirox can be applied in between the toes.  Make sure you dry well after showering in between the toes.  You can also apply small amount of Betadine to help dry the area.  You can also soak your foot in warm water with a small amount of vinegar.  Make sure you are changing shoes and socks regularly.  Monitor for any redness, swelling or any signs of infection.  Please let me know immediately should any change occur. ? ?--- ? ?Bunion ?A bunion (hallux valgus) is a bump that forms slowly on the inner side of the big toe joint. It occurs when the big toe turns toward the second toe. Bunions may be small at first, but they often get larger over time. They can make walking painful. ?What are the causes? ?This condition may be caused by: ?Wearing narrow or pointed shoes that force the big toe to press against the other toes. ?Abnormal foot development that causes the foot to roll inward. ?Changes in the foot that are caused by certain diseases, such as rheumatoid arthritis or polio. ?A foot injury. ?What increases the risk? ?The following factors may make you more likely to develop this condition: ?Wearing shoes that squeeze the toes together. ?Having certain diseases, such as: ?Rheumatoid arthritis. ?Polio. ?Cerebral palsy. ?Having family members who have bunions. ?Being born with abnormally shaped feet (a foot deformity), such as flat feet or low arches. ?Doing activities that put a lot of pressure on the feet, such as ballet dancing. ?What are the signs or symptoms? ? ?The main symptom of this condition is a bump on your big toe that you can notice. ?Other symptoms may include: ?Pain. ?Redness and inflammation around your big toe. ?Thick or hardened skin on your big toe or between your toes. ?Stiffness or loss of motion in your big toe. ?Trouble with walking. ?How is this diagnosed? ?This condition may be diagnosed based on  your symptoms, medical history, and activities. You may also have tests and imaging, such as: ?X-rays. These allow your health care provider to check the position of the bones in your foot and look for damage to your joint. They also help your health care provider determine the severity of your bunion and the best way to treat it. ?Joint aspiration. In this test, a sample of fluid is removed from the toe joint. This test may be done if you are in a lot of pain. It helps rule out diseases that cause painful swelling of the joints, such as arthritis or gout. ?How is this treated? ?Treatment depends on the severity of your symptoms. The goal of treatment is to relieve symptoms and prevent your bunion from getting worse. Your health care provider may recommend: ?Wearing shoes that have a wide toe box, or using bunion pads to cushion the affected area. ?Taping your toes together to keep them in a normal position. ?Placing a device inside your shoe (orthotic device) to help reduce pressure on your toe joint. ?Taking medicine to ease pain and inflammation. ?Putting ice or heat on the affected area. ?Doing stretching exercises. ?Surgery, for severe cases. ?Follow these instructions at home: ?Managing pain, stiffness, and swelling ? ?  ? ?If directed, put ice on the painful area. To do this: ?Put ice in a plastic bag. ?Place a towel between your skin and the bag. ?Leave the ice on for 20 minutes, 2-3  times a day. ?Remove the ice if your skin turns bright red. This is very important. If you cannot feel pain, heat, or cold, you have a greater risk of damage to the area. ?If directed, apply heat to the affected area before you exercise. Use the heat source that your health care provider recommends, such as a moist heat pack or a heating pad. ?Place a towel between your skin and the heat source. ?Leave the heat on for 20-30 minutes. ?Remove the heat if your skin turns bright red. This is especially important if you are unable to  feel pain, heat, or cold. You have a greater risk of getting burned. ?General instructions ?Do exercises as told by your health care provider. ?Support your toe joint with proper footwear, shoe padding, or taping as told by your health care provider. ?Take over-the-counter and prescription medicines only as told by your health care provider. ?Do not use any products that contain nicotine or tobacco, such as cigarettes, e-cigarettes, and chewing tobacco. If you need help quitting, ask your health care provider. ?Keep all follow-up visits. This is important. ?Contact a health care provider if: ?Your symptoms get worse. ?Your symptoms do not improve in 2 weeks. ?Get help right away if: ?You have severe pain and trouble with walking. ?Summary ?A bunion is a bump on the inner side of the big toe joint that forms when the big toe turns toward the second toe. ?Bunions can make walking painful. ?Treatment depends on the severity of your symptoms. ?Support your toe joint with proper footwear, shoe padding, or taping as told by your health care provider. ?This information is not intended to replace advice given to you by your health care provider. Make sure you discuss any questions you have with your health care provider. ?Document Revised: 08/01/2019 Document Reviewed: 08/01/2019 ?Elsevier Patient Education ? [place]. ? ?

## 2021-08-25 NOTE — Progress Notes (Signed)
Subjective:   Patient ID: Edgar Clark, male   DOB: 66 y.o.   MRN: [id number]   HPI 66 year old male presents the office today with concerns of athlete's foot on the fourth interspace on the right foot.  He has had this for many years and normally he treated himself and it goes away but this time it has not.  He has noted some white discoloration of the area.  Previously been on oral Keflex x 2 rounds as well as clotrimazole that was prescribed by his primary care doctor without significant improvement.  Also secondary concerns of a bunion the right foot which causes some intermittent discomfort.  No recent treatment.  No recent injuries that he reports.  He has no other concerns.   Review of Systems  All other systems reviewed and are negative.  Past Medical History:  Diagnosis Date   Allergy    Chronic kidney disease    kidney stones    Past Surgical History:  Procedure Laterality Date   COLONOSCOPY     EXTRACORPOREAL SHOCK WAVE LITHOTRIPSY Left 10/14/2020   Procedure: EXTRACORPOREAL SHOCK WAVE LITHOTRIPSY (ESWL);  Surgeon: Franchot Gallo, MD;  Location: [facility];  Service: Urology;  Laterality: Left;   POLYPECTOMY     SEPTOPLASTY     SHOULDER ARTHROSCOPY Bilateral      Current Outpatient Medications:    Ciclopirox 0.77 % gel, Apply 1 application. topically 2 (two) times daily., Disp: 45 g, Rfl: 0   terbinafine (LAMISIL) 250 MG tablet, Take 1 tablet (250 mg total) by mouth daily., Disp: 14 tablet, Rfl: 0   atorvastatin (LIPITOR) 40 MG tablet, Take 10 mg by mouth daily at 6 PM. , Disp: , Rfl:    ibuprofen (ADVIL) 800 MG tablet, Take 800 mg by mouth every 8 (eight) hours as needed., Disp: , Rfl:    oxyCODONE (ROXICODONE) 5 MG immediate release tablet, 1-2 po q 6 hr prn pain, Disp: 10 tablet, Rfl: 0   PARoxetine (PAXIL) 20 MG tablet, Take 20 mg by mouth daily., Disp: , Rfl:    tamsulosin (FLOMAX) 0.4 MG CAPS capsule, Take 0.4 mg by mouth daily., Disp: , Rfl:    No Known Allergies        Objective:  Physical Exam  General: AAO x3, NAD  Dermatological: On the right fourth interspace is macerated tissue with hyperkeratotic tissue.  There is no drainage or pus but there is no open lesion.  No fluctuance or crepitation.  Vascular: Dorsalis Pedis artery and Posterior Tibial artery pedal pulses are 2/4 bilateral with immedate capillary fill time. There is no pain with calf compression, swelling, warmth, erythema.   Neruologic: Grossly intact via light touch bilateral.   Musculoskeletal: Mild bunion deformities present on the right foot without any erythema or warmth.  There is no crepitation or restriction with MPJ range of motion.  Tenderness is directly along the medial first metatarsal head.  Muscular strength 5/5 in all groups tested bilateral.  Gait: Unassisted, Nonantalgic.       Assessment:   66 year old male with right fourth interspace infection; bunion right     Plan:  -Treatment options discussed including all alternatives, risks, and complications -Etiology of symptoms were discussed -X-rays were obtained and reviewed with the patient.  3 views of the right foot were obtained.  There is minimal bunion present.  There is some edema noted along the medial first metatarsal head.  No evidence of acute fracture, osteomyelitis.  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 -Regards to the right fourth interspace I did debride the hyperkeratotic tissue and sent this for an interspace web panel to [facility] labs.  I prescribed 2 weeks of oral Lamisil as well as ciclopirox gel to apply interdigitally.  Discussed drying thoroughly and he also apply a small mount of Betadine to keep the area dry.  Monitor for any signs or symptoms of infection. -Regards to the bunion we discussed different anti-inflammatories including topical Voltaren.  Consider steroid injection if needed.  Discussed wearing supportive shoes that avoid pressure.  Trula Slade DPM

## 2023-03-02 ENCOUNTER — Ambulatory Visit: Payer: Medicare Other | Attending: Cardiology | Admitting: Cardiology

## 2023-03-02 ENCOUNTER — Encounter: Payer: Self-pay | Admitting: Cardiology

## 2023-03-02 VITALS — BP 130/90 | HR 63 | Ht 75.0 in | Wt 206.0 lb

## 2023-03-02 DIAGNOSIS — Z136 Encounter for screening for cardiovascular disorders: Secondary | ICD-10-CM | POA: Diagnosis not present

## 2023-03-02 DIAGNOSIS — E7800 Pure hypercholesterolemia, unspecified: Secondary | ICD-10-CM | POA: Diagnosis not present

## 2023-03-02 DIAGNOSIS — Z8249 Family history of ischemic heart disease and other diseases of the circulatory system: Secondary | ICD-10-CM

## 2023-03-02 DIAGNOSIS — I440 Atrioventricular block, first degree: Secondary | ICD-10-CM | POA: Diagnosis not present

## 2023-03-02 MED ORDER — METOPROLOL TARTRATE 50 MG PO TABS: 50.0000 mg | ORAL_TABLET | Freq: Once | ORAL | 0 refills | Status: DC

## 2023-03-02 NOTE — Progress Notes (Signed)
Edgar Creamer, MD Reason for referral-hyperlipidemia  HPI: 67 year old male for evaluation of hyperlipidemia at request of Edgar Apley, MD.  Coronary CTA August 2009 normal with calcium score 0 and ejection fraction 57%. Pt denies dyspnea on exertion, orthopnea, PND, pedal edema, chest pain or syncope.  He can walk 4 miles at a time with having no symptoms.  Current Outpatient Medications  Medication Sig Dispense Refill   atorvastatin (LIPITOR) 40 MG tablet Take 10 mg by mouth daily at 6 PM.      Ciclopirox 0.77 % gel Apply 1 application. topically 2 (two) times daily. 45 g 0   PARoxetine (PAXIL) 20 MG tablet Take 20 mg by mouth daily.     tamsulosin (FLOMAX) 0.4 MG CAPS capsule Take 0.4 mg by mouth daily.     ibuprofen (ADVIL) 800 MG tablet Take 800 mg by mouth every 8 (eight) hours as needed.     oxyCODONE (ROXICODONE) 5 MG immediate release tablet 1-2 po q 6 hr prn pain 10 tablet 0   terbinafine (LAMISIL) 250 MG tablet Take 1 tablet (250 mg total) by mouth daily. 14 tablet 0   No current facility-administered medications for this visit.    No Known Allergies   Past Medical History:  Diagnosis Date   Allergy    Chronic kidney disease    kidney stones   Hyperlipidemia     Past Surgical History:  Procedure Laterality Date   COLONOSCOPY     EXTRACORPOREAL SHOCK WAVE LITHOTRIPSY Left 10/14/2020   Procedure: EXTRACORPOREAL SHOCK WAVE LITHOTRIPSY (ESWL);  Surgeon: Marcine Matar, MD;  Location: [facility];  Service: Urology;  Laterality: Left;   POLYPECTOMY     SEPTOPLASTY     SHOULDER ARTHROSCOPY Bilateral     Social History   Socioeconomic History   Marital status: Married    Spouse name: Not on file   Number of children: 2   Years of education: Not on file   Highest education level: Not on file  Occupational History   Not on file  Tobacco Use   Smoking status: Never   Smokeless tobacco: Never  Substance and Sexual Activity    Alcohol use: Yes    Alcohol/week: 0.0 standard drinks of alcohol    Comment: occ   Drug use: Never   Sexual activity: Not on file  Other Topics Concern   Not on file  Social History Narrative   Not on file   Social Determinants of Health   Financial Resource Strain: Not on file  Food Insecurity: Not on file  Transportation Needs: Not on file  Physical Activity: Not on file  Stress: Not on file  Social Connections: Not on file  Intimate Partner Violence: Not on file    Family History  Problem Relation Age of Onset   Heart failure Father    Heart attack Brother    Colon cancer Neg Hx    Colon polyps Neg Hx    Esophageal cancer Neg Hx    Rectal cancer Neg Hx    Stomach cancer Neg Hx     ROS: no fevers or chills, productive cough, hemoptysis, dysphasia, odynophagia, melena, hematochezia, dysuria, hematuria, rash, seizure activity, orthopnea, PND, pedal edema, claudication. Remaining systems are negative.  Physical Exam:   Blood pressure (!) 130/90, pulse 63, height 6\' 3"  (1.905 m), weight 206 lb (93.4 kg), SpO2 97%.  General:  Well developed/well nourished in NAD Skin warm/dry Patient not depressed No peripheral clubbing Back-normal HEENT-normal/normal  eyelids Neck supple/normal carotid upstroke bilaterally; no bruits; no JVD; no thyromegaly chest - CTA/ normal expansion CV - RRR/normal S1 and S2; no murmurs, rubs or gallops;  PMI nondisplaced Abdomen -NT/ND, no HSM, no mass, + bowel sounds, no bruit 2+ femoral pulses, no bruits Ext-no edema, chords, 2+ DP Neuro-grossly nonfocal  EKG Interpretation Date/Time:  Friday March 02 2023 11:12:26 EST Ventricular Rate:  63 PR Interval:  216 QRS Duration:  114 QT Interval:  398 QTC Calculation: 407 R Axis:   102  Text Interpretation: Sinus rhythm with 1st degree A-V block Rightward axis Low voltage QRS When compared with ECG of 28-Oct-2019 13:12, Premature ventricular complexes are no longer Present Confirmed by  Olga Millers ([id number]) on 03/02/2023 11:31:32 AM    A/P  1 family history of coronary artery disease-strong family history of coronary artery disease in his father and brothers.  Will arrange calcium score for risk stratification.  Continue lifestyle modification.  2 hyperlipidemia-continue Lipitor.  If calcium demonstrated in his coronaries we will plan to increase to 80 mg daily.  Will also have LP(a) drawn in January when he has his routine blood work done with primary care.  Olga Millers, MD

## 2023-03-02 NOTE — Patient Instructions (Addendum)
Medication Instructions:  Metoprolol tartrate 50 mg once 2 hours before CT scan. `  *If you need a refill on your cardiac medications before your next appointment, please call your pharmacy*    Testing/Procedures:    Your cardiac CT will be scheduled at one of the below locations:   [facility] [address] [place], [place] [address] [phone]   If scheduled at [facility], please arrive at the [facility] and Children's Entrance (Entrance C2) of [facility] 30 minutes prior to test start time. You can use the FREE valet parking offered at entrance C (encouraged to control the heart rate for the test)  Proceed to the [facility] Radiology Department (first floor) to check-in and test prep.  All radiology patients and guests should use entrance C2 at [facility], accessed from [facility], even though the hospital's physical address listed is [address].    Please follow these instructions carefully (unless otherwise directed):  An IV will be required for this test and Nitroglycerin will be given.  Hold all erectile dysfunction medications at least 3 days (72 hrs) prior to test. (Ie viagra, cialis, sildenafil, tadalafil, etc)   On the Night Before the Test: Be sure to Drink plenty of water. Do not consume any caffeinated/decaffeinated beverages or chocolate 12 hours prior to your test. Do not take any antihistamines 12 hours prior to your test.   On the Day of the Test: Drink plenty of water until 1 hour prior to the test. Do not eat any food 1 hour prior to test. You may take your regular medications prior to the test.  Take metoprolol (Lopressor) two hours prior to test. After the Test: Drink plenty of water. After receiving IV contrast, you may experience a mild flushed feeling. This is normal. On occasion, you may experience a mild rash up to 24 hours after the test. This is not dangerous. If this occurs,  you can take Benadryl 25 mg and increase your fluid intake. If you experience trouble breathing, this can be serious. If it is severe call 911 IMMEDIATELY. If it is mild, please call our office. We will call to schedule your test 2-4 weeks out understanding that some insurance companies will need an authorization prior to the service being performed.   For more information and frequently asked questions, please visit our website : [email or web address]  For non-scheduling related questions, please contact the cardiac imaging nurse navigator should you have any questions/concerns: Cardiac Imaging Nurse Navigators Direct Office Dial: [phone]   For scheduling needs, including cancellations and rescheduling, please call [place], [phone].   Follow-Up: At [facility], you and your health needs are our priority.  As part of our continuing mission to provide you with exceptional heart care, we have created designated Provider Care Teams.  These Care Teams include your primary Cardiologist (physician) and Advanced Practice Providers (APPs -  Physician Assistants and Nurse Practitioners) who all work together to provide you with the care you need, when you need it.  Your next appointment:   12 month(s)  Provider:   Olga Millers MD    Other Instructions  Ask PCP to order Lpa with next labs.

## 2023-03-13 ENCOUNTER — Ambulatory Visit (HOSPITAL_COMMUNITY)
Admission: RE | Admit: 2023-03-13 | Discharge: 2023-03-13 | Disposition: A | Discharge status: Home / Self Care | Payer: Self-pay | Source: Ambulatory Visit | Attending: Cardiology | Admitting: Cardiology

## 2023-03-13 DIAGNOSIS — Z136 Encounter for screening for cardiovascular disorders: Secondary | ICD-10-CM | POA: Insufficient documentation

## 2023-03-15 ENCOUNTER — Telehealth: Payer: Self-pay | Admitting: *Deleted

## 2023-03-15 ENCOUNTER — Other Ambulatory Visit: Payer: Self-pay | Admitting: *Deleted

## 2023-03-15 NOTE — Telephone Encounter (Signed)
-----   Message from Olga Millers sent at 03/15/2023  1:20 AM EST ----- Mildly elevated Ca score; increase lipitor to 40 mg daily; lipids and liver 8 weeks; repeat CTA one year for mildly dilated aortic root Olga Millers, MD

## 2023-03-15 NOTE — Telephone Encounter (Signed)
Spoke with pt, Aware of dr Ludwig Clarks recommendations. He feels he is already taking atorvastatin 40 mg 2 tablets daily but he is going to check that bottle. He has an appointment with his medical doctor in January and will have fasting labs at that time. He is going to have those labs sent to [place]. He is aware the LDL goal would be 50.

## 2024-03-17 ENCOUNTER — Other Ambulatory Visit: Payer: Self-pay | Admitting: *Deleted

## 2024-03-17 DIAGNOSIS — I77810 Thoracic aortic ectasia: Secondary | ICD-10-CM

## 2024-04-01 ENCOUNTER — Ambulatory Visit (HOSPITAL_COMMUNITY)
Admission: RE | Admit: 2024-04-01 | Discharge: 2024-04-01 | Disposition: A | Discharge status: Home / Self Care | Source: Ambulatory Visit | Attending: Cardiology | Admitting: Cardiology

## 2024-04-01 DIAGNOSIS — I77810 Thoracic aortic ectasia: Secondary | ICD-10-CM | POA: Insufficient documentation

## 2024-04-01 LAB — POCT I-STAT CREATININE: Creatinine, Ser: 1.2 mg/dL (ref 0.61–1.24)

## 2024-04-01 MED ORDER — IOHEXOL 350 MG/ML SOLN
75.0000 mL | Freq: Once | INTRAVENOUS | Status: AC | PRN
  Administered 2024-04-01: 75 mL via INTRAVENOUS

## 2024-04-12 ENCOUNTER — Ambulatory Visit: Payer: Self-pay | Admitting: Cardiology

## 2024-04-15 ENCOUNTER — Encounter: Payer: Self-pay | Admitting: *Deleted

## 2024-04-25 NOTE — Progress Notes (Signed)
 "    HPI: FU hyperlipidemia and coronary calcification.  Calcium score December 2024 48.4 which was 27 percentile, mildly dilated ascending aorta at 42 mm.  CTA December 2025 showed mildly dilated aortic root at 4.2 cm; coronary calcifications noted.  Since last seen the patient denies any dyspnea on exertion, orthopnea, PND, pedal edema, palpitations, syncope or chest pain.   Current Outpatient Medications  Medication Sig Dispense Refill   allopurinol (ZYLOPRIM) 100 MG tablet Take 100 mg by mouth daily.     atorvastatin (LIPITOR) 40 MG tablet Take 10 mg by mouth daily at 6 PM.      PARoxetine (PAXIL) 20 MG tablet Take 20 mg by mouth daily.     No current facility-administered medications for this visit.     Past Medical History:  Diagnosis Date   Allergy    Chronic kidney disease    kidney stones   Hyperlipidemia     Past Surgical History:  Procedure Laterality Date   COLONOSCOPY     EXTRACORPOREAL SHOCK WAVE LITHOTRIPSY Left 10/14/2020   Procedure: EXTRACORPOREAL SHOCK WAVE LITHOTRIPSY (ESWL);  Surgeon: Matilda Senior, MD;  Location: [facility];  Service: Urology;  Laterality: Left;   POLYPECTOMY     SEPTOPLASTY     SHOULDER ARTHROSCOPY Bilateral     Social History   Socioeconomic History   Marital status: Married    Spouse name: Not on file   Number of children: 2   Years of education: Not on file   Highest education level: Master's degree (e.g., MA, MS, MEng, MEd, MSW, MBA)  Occupational History   Not on file  Tobacco Use   Smoking status: Never   Smokeless tobacco: Never  Substance and Sexual Activity   Alcohol use: Yes    Alcohol/week: 0.0 standard drinks of alcohol    Comment: occ   Drug use: Never   Sexual activity: Not on file  Other Topics Concern   Not on file  Social History Narrative   Not on file   Social Drivers of Health   Tobacco Use: Low Risk (05/07/2024)   Patient History    Smoking Tobacco Use: Never    Smokeless  Tobacco Use: Never    Passive Exposure: Not on file  Financial Resource Strain: Low Risk (05/06/2024)   Overall Financial Resource Strain (CARDIA)    Difficulty of Paying Living Expenses: Not hard at all  Food Insecurity: No Food Insecurity (05/06/2024)   Epic    Worried About Radiation Protection Practitioner of Food in the Last Year: Never true    Ran Out of Food in the Last Year: Never true  Transportation Needs: No Transportation Needs (05/06/2024)   Epic    Lack of Transportation (Medical): No    Lack of Transportation (Non-Medical): No  Physical Activity: Sufficiently Active (05/06/2024)   Exercise Vital Sign    Days of Exercise per Week: 3 days    Minutes of Exercise per Session: 50 min  Stress: No Stress Concern Present (05/06/2024)   [employer] of Occupational Health - Occupational Stress Questionnaire    Feeling of Stress: Only a little  Social Connections: Socially Integrated (05/06/2024)   Social Connection and Isolation Panel    Frequency of Communication with Friends and Family: More than three times a week    Frequency of Social Gatherings with Friends and Family: More than three times a week    Attends Religious Services: 1 to 4 times per year    Active Member of [employer]  or Organizations: Yes    Attends Banker Meetings: More than 4 times per year    Marital Status: Married  Catering Manager Violence: Not on file  Depression (EYV7-0): Not on file  Alcohol Screen: Low Risk (05/06/2024)   Alcohol Screen    Last Alcohol Screening Score (AUDIT): 5  Housing: Low Risk (05/06/2024)   Epic    Unable to Pay for Housing in the Last Year: No    Number of Times Moved in the Last Year: 0    Homeless in the Last Year: No  Utilities: Not on file  Health Literacy: Not on file    Family History  Problem Relation Age of Onset   Heart failure Father    Heart attack Brother    Colon cancer Neg Hx    Colon polyps Neg Hx    Esophageal cancer Neg Hx    Rectal cancer Neg Hx    Stomach  cancer Neg Hx     ROS: no fevers or chills, productive cough, hemoptysis, dysphasia, odynophagia, melena, hematochezia, dysuria, hematuria, rash, seizure activity, orthopnea, PND, pedal edema, claudication. Remaining systems are negative.  Physical Exam: Well-developed well-nourished in no acute distress.  Skin is warm and dry.  HEENT is normal.  Neck is supple.  Chest is clear to auscultation with normal expansion.  Cardiovascular exam is regular rate and rhythm.  Abdominal exam nontender or distended. No masses palpated. Extremities show no edema. neuro grossly intact  EKG Interpretation Date/Time:  Wednesday May 07 2024 10:37:19 EST Ventricular Rate:  67 PR Interval:  220 QRS Duration:  108 QT Interval:  388 QTC Calculation: 409 R Axis:   22  Text Interpretation: Sinus rhythm with 1st degree A-V block Confirmed by Pietro Rogue ([id number]) on 05/07/2024 10:38:52 AM    A/P  1 coronary calcification-continue Lipitor.  Will have most recent lipids and liver forwarded to us  from primary care.  2 hyperlipidemia-continue statin.  3 mildly dilated aortic root-4.2 cm on recent CTA.  Will repeat study in 2 years.  Rogue Pietro, MD    "

## 2024-05-07 ENCOUNTER — Ambulatory Visit: Attending: Cardiology | Admitting: Cardiology

## 2024-05-07 ENCOUNTER — Encounter: Payer: Self-pay | Admitting: Cardiology

## 2024-05-07 VITALS — BP 130/80 | HR 69 | Ht 75.0 in | Wt 207.8 lb

## 2024-05-07 DIAGNOSIS — I77810 Thoracic aortic ectasia: Secondary | ICD-10-CM

## 2024-05-07 DIAGNOSIS — E7800 Pure hypercholesterolemia, unspecified: Secondary | ICD-10-CM | POA: Diagnosis not present

## 2024-05-07 DIAGNOSIS — I2510 Atherosclerotic heart disease of native coronary artery without angina pectoris: Secondary | ICD-10-CM

## 2024-05-07 NOTE — Patient Instructions (Signed)
   Follow-Up: At [facility], you and your health needs are our priority.  As part of our continuing mission to provide you with exceptional heart care, our providers are all part of one team.  This team includes your primary Cardiologist (physician) and Advanced Practice Providers or APPs (Physician Assistants and Nurse Practitioners) who all work together to provide you with the care you need, when you need it.  Your next appointment:   12 month(s)  Provider:   Redell Shallow MD
